# Patient Record
Sex: Female | Born: 1995 | Race: Black or African American | Hispanic: No | Smoking: Never smoker
Health system: Southern US, Community
[De-identification: ages and names within clinical notes are randomized; demographics above are authoritative.]

## PROBLEM LIST (undated history)

## (undated) ENCOUNTER — Inpatient Hospital Stay (HOSPITAL_COMMUNITY): Payer: Self-pay

## (undated) DIAGNOSIS — R011 Cardiac murmur, unspecified: Secondary | ICD-10-CM

## (undated) HISTORY — PX: ESOPHAGOGASTRODUODENOSCOPY: SHX1529

## (undated) HISTORY — PX: WISDOM TOOTH EXTRACTION: SHX21

---

## 2015-08-24 ENCOUNTER — Encounter (HOSPITAL_COMMUNITY): Payer: Self-pay

## 2015-08-24 ENCOUNTER — Emergency Department (HOSPITAL_COMMUNITY)
Admission: EM | Admit: 2015-08-24 | Discharge: 2015-08-24 | Disposition: A | Discharge status: Home / Self Care | Attending: Emergency Medicine | Admitting: Emergency Medicine

## 2015-08-24 DIAGNOSIS — R1030 Lower abdominal pain, unspecified: Secondary | ICD-10-CM | POA: Diagnosis present

## 2015-08-24 DIAGNOSIS — B9689 Other specified bacterial agents as the cause of diseases classified elsewhere: Secondary | ICD-10-CM

## 2015-08-24 DIAGNOSIS — N760 Acute vaginitis: Secondary | ICD-10-CM | POA: Diagnosis not present

## 2015-08-24 DIAGNOSIS — N72 Inflammatory disease of cervix uteri: Secondary | ICD-10-CM

## 2015-08-24 LAB — URINE MICROSCOPIC-ADD ON

## 2015-08-24 LAB — URINALYSIS, ROUTINE W REFLEX MICROSCOPIC
BILIRUBIN URINE: NEGATIVE
Glucose, UA: NEGATIVE mg/dL
Hgb urine dipstick: NEGATIVE
KETONES UR: NEGATIVE mg/dL
NITRITE: NEGATIVE
PROTEIN: NEGATIVE mg/dL
SPECIFIC GRAVITY, URINE: 1.019 (ref 1.005–1.030)
pH: 8 (ref 5.0–8.0)

## 2015-08-24 LAB — WET PREP, GENITAL
SPERM: NONE SEEN
Trich, Wet Prep: NONE SEEN
YEAST WET PREP: NONE SEEN

## 2015-08-24 LAB — RAPID HIV SCREEN (HIV 1/2 AB+AG)
HIV 1/2 Antibodies: NONREACTIVE
HIV-1 P24 ANTIGEN - HIV24: NONREACTIVE

## 2015-08-24 LAB — POC URINE PREG, ED: PREG TEST UR: NEGATIVE

## 2015-08-24 MED ORDER — METRONIDAZOLE 500 MG PO TABS: 500.0000 mg | ORAL_TABLET | Freq: Two times a day (BID) | ORAL | 0 refills | Status: DC

## 2015-08-24 MED ORDER — DOXYCYCLINE HYCLATE 100 MG PO CAPS: 100.0000 mg | ORAL_CAPSULE | Freq: Two times a day (BID) | ORAL | 0 refills | Status: DC

## 2015-08-24 NOTE — Discharge Instructions (Signed)
You have been evaluated for your lower abdominal pain.  There's evidence of infection in your urine.  We have obtain labs to check for potential STD.  You will be notified if you are tested positive for any specific infection that we can treat.  Take antibiotics as prescribed for the full duration. A void drinking alcohol while on antibiotic.  Avoid sexual activities until your symptoms resolved.

## 2015-08-24 NOTE — ED Triage Notes (Signed)
Pt c/o intermittent lower abdominal pain x 1 week.  Pain score 5/10.  Sts pain is worse with sitting.  Sts LMP x 2 weeks ago and was "lighter than normal."  Denies n/v/d.  Denies GU complaints.

## 2015-08-24 NOTE — ED Provider Notes (Signed)
[facility]-EMERGENCY DEPT Provider Note   CSN: [id number] Arrival date & time: 08/24/15  0626  First Provider Contact:  None       History   Chief Complaint Chief Complaint  Patient presents with  . Abdominal Pain    HPI Annette Steele is a 20 y.o. female.  HPI   20 year old female presents for evaluation of abdominal pain. Patient reports gradual onset of persistent low abdominal pain ongoing for the past week. She described pain as sharp achy throbbing sensation, worsening when she sits stills and improves when she moves around. She rates her pain as 7 out of 10. She denies any specific treatment tried. She denies any associated fever, headache, lightheadedness, dizziness, chest pain, shortness of breath, back pain, dysuria, hematuria, vaginal bleeding, vaginal discharge, or rash. She is sexually active and does not use protection every single time. Remote history of STD. Last menstrual period was 2 weeks ago. She denies any recent strenuous activities. Denies any postprandial pain, nausea vomiting diarrhea or change in bowel or bladder habit.    History reviewed. No pertinent past medical history.  There are no active problems to display for this patient.   Past Surgical History:  Procedure Laterality Date  . ESOPHAGOGASTRODUODENOSCOPY      OB History    No data available       Home Medications    Prior to Admission medications   Not on File    Family History History reviewed. No pertinent family history.  Social History Social History  Substance Use Topics  . Smoking status: Never Smoker  . Smokeless tobacco: Never Used  . Alcohol use No     Allergies   Review of patient's allergies indicates not on file.   Review of Systems Review of Systems  All other systems reviewed and are negative.    Physical Exam Updated Vital Signs BP 141/74 (BP Location: Left Arm)   Pulse 84   Temp 97.9 F (36.6 C) (Oral)   Resp 16   LMP 08/08/2015   SpO2 100%    Physical Exam  Constitutional: She appears well-developed and well-nourished. No distress.  African-American female, well-appearing, in no acute discomfort.  HENT:  Head: Atraumatic.  Eyes: Conjunctivae are normal.  Neck: Neck supple.  Cardiovascular: Normal rate and regular rhythm.   Pulmonary/Chest: Effort normal and breath sounds normal.  Abdominal: Soft. There is tenderness (Suprapubic tenderness on palpation without guarding or rebound tenderness. Negative Murphy sign, no pain at McBurney's point, no peritoneal sign.).  Genitourinary:  Genitourinary Comments: Chaperone present during exam. 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 These lesions are nontender.  is no pain with speculum insertion. Closed cervical os with mild functional vaginal discharge and no bleeding. On bimanual examination, no adnexal tenderness or cervical motion tenderness.   Neurological: She is alert.  Skin: No rash noted.  Psychiatric: She has a normal mood and affect.  Nursing note and vitals reviewed.    ED Treatments / Results  Labs (all labs ordered are listed, but only abnormal results are displayed) Labs Reviewed  WET PREP, GENITAL - Abnormal; Notable for the following:       Result Value   Clue Cells Wet Prep HPF POC PRESENT (*)    WBC, Wet Prep HPF POC MANY (*)    All other components within normal limits  URINALYSIS, ROUTINE W REFLEX MICROSCOPIC (NOT AT [facility]) - Abnormal; Notable for the following:    APPearance CLOUDY (*)  Leukocytes, UA MODERATE (*)    All other components within normal limits  URINE MICROSCOPIC-ADD ON - Abnormal; Notable for the following:    Squamous Epithelial / LPF 6-30 (*)    Bacteria, UA MANY (*)    All other components within normal limits  RAPID HIV SCREEN (HIV 1/2 AB+AG)  RPR  POC URINE PREG, ED  GC/CHLAMYDIA PROBE AMP ([place]) NOT AT [facility]    EKG  EKG Interpretation None       Radiology No results  found.  Procedures Procedures (including critical care time)  Medications Ordered in ED Medications - No data to display   Initial Impression / Assessment and Plan / ED Course  I have reviewed the triage vital signs and the nursing notes.  Pertinent labs & imaging results that were available during my care of the patient were reviewed by me and considered in my medical decision making (see chart for details).  Clinical Course    BP 121/81 (BP Location: Left Arm)   Pulse 72   Temp 97.9 F (36.6 C) (Oral)   Resp 15   LMP 08/08/2015   SpO2 100%    Final Clinical Impressions(s) / ED Diagnoses   Final diagnoses:  Cervicitis  BV (bacterial vaginosis)    New Prescriptions New Prescriptions   DOXYCYCLINE (VIBRAMYCIN) 100 MG CAPSULE    Take 1 capsule (100 mg total) by mouth 2 (two) times daily.   METRONIDAZOLE (FLAGYL) 500 MG TABLET    Take 1 tablet (500 mg total) by mouth 2 (two) times daily.   9:56 AM Patient here with low abdominal pain. Pain is been ongoing for 1 week. Pain does not suggest appendicitis based on presentation. Pelvic exam be performed. She is afebrile with stable normal vital sign and resting comfortably.  12:37 PM Patient does not have any significant discomfort on pelvic examination. However, her UA shows many bacteria and 6-30 WBC, and a wet prep shows presence of clue cells and many WBC. I'm concerned of potential STD. I plan on treating patient with doxycycline and Flagyl. Her pregnancy test is negative. STD culture sent. Return precaution discussed. She understands to discuss with her sexual partner if she is tested positive for STD.   Fayrene Helper, PA-C 08/24/15 1241    Pricilla Loveless, MD 08/24/15 [phone]

## 2015-08-25 LAB — RPR: RPR Ser Ql: NONREACTIVE

## 2015-08-27 LAB — GC/CHLAMYDIA PROBE AMP (~~LOC~~) NOT AT ARMC
CHLAMYDIA, DNA PROBE: NEGATIVE
Neisseria Gonorrhea: NEGATIVE

## 2015-09-17 ENCOUNTER — Telehealth (HOSPITAL_BASED_OUTPATIENT_CLINIC_OR_DEPARTMENT_OTHER): Payer: Self-pay | Admitting: Emergency Medicine

## 2015-12-23 ENCOUNTER — Encounter (HOSPITAL_COMMUNITY): Payer: Self-pay

## 2015-12-23 ENCOUNTER — Emergency Department (HOSPITAL_COMMUNITY)
Admission: EM | Admit: 2015-12-23 | Discharge: 2015-12-23 | Disposition: A | Discharge status: Home / Self Care | Attending: Emergency Medicine | Admitting: Emergency Medicine

## 2015-12-23 ENCOUNTER — Emergency Department (HOSPITAL_COMMUNITY)

## 2015-12-23 DIAGNOSIS — O23591 Infection of other part of genital tract in pregnancy, first trimester: Secondary | ICD-10-CM | POA: Insufficient documentation

## 2015-12-23 DIAGNOSIS — N760 Acute vaginitis: Secondary | ICD-10-CM

## 2015-12-23 DIAGNOSIS — R102 Pelvic and perineal pain: Secondary | ICD-10-CM

## 2015-12-23 DIAGNOSIS — B9689 Other specified bacterial agents as the cause of diseases classified elsewhere: Secondary | ICD-10-CM | POA: Insufficient documentation

## 2015-12-23 DIAGNOSIS — O0281 Inappropriate change in quantitative human chorionic gonadotropin (hCG) in early pregnancy: Secondary | ICD-10-CM | POA: Diagnosis not present

## 2015-12-23 DIAGNOSIS — O26891 Other specified pregnancy related conditions, first trimester: Secondary | ICD-10-CM | POA: Diagnosis present

## 2015-12-23 DIAGNOSIS — Z3A01 Less than 8 weeks gestation of pregnancy: Secondary | ICD-10-CM | POA: Insufficient documentation

## 2015-12-23 LAB — COMPREHENSIVE METABOLIC PANEL
ALK PHOS: 49 U/L (ref 38–126)
ALT: 12 U/L — AB (ref 14–54)
AST: 15 U/L (ref 15–41)
Albumin: 3.9 g/dL (ref 3.5–5.0)
Anion gap: 6 (ref 5–15)
BUN: 11 mg/dL (ref 6–20)
CALCIUM: 9 mg/dL (ref 8.9–10.3)
CHLORIDE: 105 mmol/L (ref 101–111)
CO2: 25 mmol/L (ref 22–32)
CREATININE: 0.77 mg/dL (ref 0.44–1.00)
Glucose, Bld: 92 mg/dL (ref 65–99)
Potassium: 3.9 mmol/L (ref 3.5–5.1)
SODIUM: 136 mmol/L (ref 135–145)
Total Bilirubin: 0.7 mg/dL (ref 0.3–1.2)
Total Protein: 7 g/dL (ref 6.5–8.1)

## 2015-12-23 LAB — CBC
HCT: 36.7 % (ref 36.0–46.0)
Hemoglobin: 13 g/dL (ref 12.0–15.0)
MCH: 31.2 pg (ref 26.0–34.0)
MCHC: 35.4 g/dL (ref 30.0–36.0)
MCV: 88 fL (ref 78.0–100.0)
PLATELETS: 215 10*3/uL (ref 150–400)
RBC: 4.17 MIL/uL (ref 3.87–5.11)
RDW: 11.8 % (ref 11.5–15.5)
WBC: 8.9 10*3/uL (ref 4.0–10.5)

## 2015-12-23 LAB — DIFFERENTIAL
BASOS PCT: 1 %
Basophils Absolute: 0 10*3/uL (ref 0.0–0.1)
EOS ABS: 0.2 10*3/uL (ref 0.0–0.7)
EOS PCT: 3 %
Lymphocytes Relative: 35 %
Lymphs Abs: 3.1 10*3/uL (ref 0.7–4.0)
MONO ABS: 0.5 10*3/uL (ref 0.1–1.0)
MONOS PCT: 6 %
Neutro Abs: 5 10*3/uL (ref 1.7–7.7)
Neutrophils Relative %: 55 %

## 2015-12-23 LAB — URINALYSIS, ROUTINE W REFLEX MICROSCOPIC
BILIRUBIN URINE: NEGATIVE
GLUCOSE, UA: NEGATIVE mg/dL
HGB URINE DIPSTICK: NEGATIVE
KETONES UR: NEGATIVE mg/dL
Leukocytes, UA: NEGATIVE
Nitrite: NEGATIVE
PROTEIN: NEGATIVE mg/dL
Specific Gravity, Urine: 1.024 (ref 1.005–1.030)
pH: 7 (ref 5.0–8.0)

## 2015-12-23 LAB — WET PREP, GENITAL
Sperm: NONE SEEN
Trich, Wet Prep: NONE SEEN
YEAST WET PREP: NONE SEEN

## 2015-12-23 LAB — HCG, QUANTITATIVE, PREGNANCY: HCG, BETA CHAIN, QUANT, S: 4968 m[IU]/mL — AB (ref <5)

## 2015-12-23 LAB — LIPASE, BLOOD: LIPASE: 21 U/L (ref 11–51)

## 2015-12-23 MED ORDER — ONDANSETRON HCL 4 MG/2ML IJ SOLN
4.0000 mg | Freq: Once | INTRAMUSCULAR | Status: AC
  Administered 2015-12-23: 4 mg via INTRAVENOUS
  Filled 2015-12-23: qty 2

## 2015-12-23 MED ORDER — MORPHINE SULFATE (PF) 4 MG/ML IV SOLN
4.0000 mg | Freq: Once | INTRAVENOUS | Status: AC
  Administered 2015-12-23: 4 mg via INTRAVENOUS
  Filled 2015-12-23: qty 1

## 2015-12-23 MED ORDER — METRONIDAZOLE 500 MG PO TABS: 500.0000 mg | ORAL_TABLET | Freq: Two times a day (BID) | ORAL | 0 refills | Status: DC

## 2015-12-23 NOTE — ED Provider Notes (Signed)
[facility]PT Provider Note   CSN: [id number] Arrival date & time: 12/23/15  1420     History   Chief Complaint Chief Complaint  Patient presents with  . Abdominal Pain    HPI Annette Steele is a 20 y.o. female.  Patient presents to the emergency department with chief complaint of lower abdominal pain. She states that she has been having intermittent pelvic/lower abdominal pain for the past 4-5 days. She states that it comes in waves, and feels sharp and is rated a 8 out of 10 at its worst. Currently she is experiencing 4 out of 10 pain. She has not tried taking anything for her symptoms. She denies any fevers, chills, vomiting, diarrhea, dysuria, or vaginal discharge. She states that she has felt nauseated from time to time. There are no modifying factors. She denies any prior abdominal surgeries.   The history is provided by the patient. No language interpreter was used.    History reviewed. No pertinent past medical history.  There are no active problems to display for this patient.   Past Surgical History:  Procedure Laterality Date  . ESOPHAGOGASTRODUODENOSCOPY      OB History    No data available       Home Medications    Prior to Admission medications   Medication Sig Start Date End Date Taking? Authorizing Provider  cetirizine (ZYRTEC) 10 MG tablet Take 10 mg by mouth daily as needed for allergies.    Historical Provider, MD  doxycycline (VIBRAMYCIN) 100 MG capsule Take 1 capsule (100 mg total) by mouth 2 (two) times daily. 08/24/15   Fayrene HelperBowie Tran, PA-C  metroNIDAZOLE (FLAGYL) 500 MG tablet Take 1 tablet (500 mg total) by mouth 2 (two) times daily. 08/24/15   Fayrene HelperBowie Tran, PA-C  omeprazole (PRILOSEC) 20 MG capsule Take 20 mg by mouth daily.    Historical Provider, MD    Family History History reviewed. No pertinent family history.  Social History Social History  Substance Use Topics  . Smoking status: Never Smoker  . Smokeless tobacco: Never Used  .  Alcohol use No     Allergies   Patient has no known allergies.   Review of Systems Review of Systems  Gastrointestinal: Positive for abdominal pain and nausea.  All other systems reviewed and are negative.    Physical Exam Updated Vital Signs BP 132/67 (BP Location: Left Arm)   Pulse 81   Temp 98 F (36.7 C) (Oral)   Resp 16   Ht 5\' 3"  (1.6 m)   Wt 73.4 kg   LMP 11/22/2015   SpO2 100%   BMI 28.66 kg/m   Physical Exam  Constitutional: She is oriented to person, place, and time. She appears well-developed and well-nourished.  HENT:  Head: Normocephalic and atraumatic.  Eyes: Conjunctivae and EOM are normal. Pupils are equal, round, and reactive to light.  Neck: Normal range of motion. Neck supple.  Cardiovascular: Normal rate and regular rhythm.  Exam reveals no gallop and no friction rub.   No murmur heard. Pulmonary/Chest: Effort normal and breath sounds normal. No respiratory distress. She has no wheezes. She has no rales. She exhibits no tenderness.  Abdominal: Soft. Bowel sounds are normal. She exhibits no distension and no mass. There is no tenderness. There is no rebound and no guarding.  No focal abdominal tenderness, no RLQ tenderness or pain at McBurney's point, no RUQ tenderness or Murphy's sign, no left-sided abdominal tenderness, no fluid wave, or signs of peritonitis   Genitourinary:  Genitourinary Comments: Left adnexal tenderness, mild vaginal discharge, no other abnormality noted on pelvic exam.  Chaperone present during entire exam.  Musculoskeletal: Normal range of motion. She exhibits no edema or tenderness.  Neurological: She is alert and oriented to person, place, and time.  Skin: Skin is warm and dry.  Psychiatric: She has a normal mood and affect. Her behavior is normal. Judgment and thought content normal.  Nursing note and vitals reviewed.    ED Treatments / Results  Labs (all labs ordered are listed, but only abnormal results are  displayed) Labs Reviewed  CBC  DIFFERENTIAL  LIPASE, BLOOD  COMPREHENSIVE METABOLIC PANEL  URINALYSIS, ROUTINE W REFLEX MICROSCOPIC (NOT [facility])  HCG, QUANTITATIVE, PREGNANCY  CBC WITH DIFFERENTIAL/PLATELET    EKG  EKG Interpretation None       Radiology No results found.  Procedures Procedures (including critical care time)  Medications Ordered in ED Medications  morphine 4 MG/ML injection 4 mg (not administered)  ondansetron (ZOFRAN) injection 4 mg (not administered)     Initial Impression / Assessment and Plan / ED Course  I have reviewed the triage vital signs and the nursing notes.  Pertinent labs & imaging results that were available during my care of the patient were reviewed by me and considered in my medical decision making (see chart for details).  Clinical Course     Patient with lower abdominal pain 4-5 days. Will check labs, treat pain, and reassess. Denies dysuria or hematuria. Denies any vaginal discharge.   HCG is elevated. Patient does have some left adnexal tenderness. Will check ultrasound.  Clue cells seen on wet prep. Will treat for bacterial vaginosis.  No vaginal bleeding.  Ultrasound is remarkable for suspected intrauterine pregnancy, however follow-up hCG is recommended. I discussed this plan with patient. She'll follow-[facility] with her OB[facility]GYN [facility] for repeat hCG.  Final Clinical Impressions(s) / ED Diagnoses   Final diagnoses:  BV (bacterial vaginosis)  Less than [redacted] weeks gestation of pregnancy    New Prescriptions Discharge Medication List as of 12/23/2015  5:58 PM       Roxy Horsemanobert Kuron Docken, PA-C 12/23/15 2204    Linwood DibblesJon Knapp, MD 12/24/15 1154

## 2015-12-23 NOTE — ED Triage Notes (Signed)
PT C/O GENERALIZED ABDOMINAL PAIN X3 DAYS. PT STS THE PAIN COMES IN DIFFERENT AREAS OF HER STOMACH AND SOMETIMES AROUND THE HIP AREAS. PT ALSO STS URINARY FREQUENCY. DENIES FEVER, VOMITING,OR DIARRHEA, BUT HAD 1 EPISODE OF NAUSEA.

## 2015-12-25 ENCOUNTER — Other Ambulatory Visit

## 2015-12-25 DIAGNOSIS — Z3A08 8 weeks gestation of pregnancy: Secondary | ICD-10-CM

## 2015-12-26 LAB — HCG, QUANTITATIVE, PREGNANCY: hCG, Beta Chain, Quant, S: 7739.7 m[IU]/mL — ABNORMAL HIGH

## 2015-12-26 LAB — GC/CHLAMYDIA PROBE AMP (~~LOC~~) NOT AT ARMC
Chlamydia: NEGATIVE
Neisseria Gonorrhea: NEGATIVE

## 2015-12-27 ENCOUNTER — Telehealth: Payer: Self-pay | Admitting: *Deleted

## 2015-12-27 NOTE — Telephone Encounter (Signed)
Pt left message yesterday requesting test results.  

## 2016-01-08 NOTE — Telephone Encounter (Signed)
Called patient back and informed her of results and need for follow up ultrasound. Patient states she just had an ultrasound last Friday in [place]. Patient had no questions or concerns

## 2016-01-28 NOTE — L&D Delivery Note (Signed)
Patient is a 21 y.o. now G2P2002 s/p NSVD at 758w0d, who was admitted for SOL.  Delivery Note At 4:15 PM a viable female was delivered via Vaginal, Spontaneous (Presentation: direct OA).  APGAR:\ 9, 9; weight pending .   Placenta status: intact 3VC:  with the following complications: nuchal/body cord .  Cord pH: n/a  Anesthesia:  epidural Episiotomy: None Lacerations: None Suture Repair: n/a Est. Blood Loss (mL): 110  Head delivered direct OA. Body and nuchal cord present. Shoulder and body delivered with corkscew technique through cord which was then reduced. Infant with spontaneous cry, placed on mother's abdomen, dried and bulb suctioned. Cord clamped x 2 after 1-minute delay, and cut by family member. Cord blood drawn. Placenta delivered spontaneously with gentle cord traction. Fundus firm with massage and Pitocin. Perineum inspected and found to have no  Lacerations requiring repair  Mom to postpartum.  Baby to Couplet care / Skin to Skin.  Marthenia RollingScott Bland 12/23/2016, 4:36 PM  Please schedule this patient for PP visit in: 4 weeks Low risk pregnancy complicated by: none Delivery mode:  SVD Anticipated Birth Control:  other/unsure PP Procedures needed: none  Schedule Integrated BH visit: no Provider: Any provider

## 2016-04-09 ENCOUNTER — Emergency Department (HOSPITAL_COMMUNITY)
Admission: EM | Admit: 2016-04-09 | Discharge: 2016-04-09 | Disposition: A | Discharge status: Home / Self Care | Attending: Emergency Medicine | Admitting: Emergency Medicine

## 2016-04-09 ENCOUNTER — Encounter (HOSPITAL_COMMUNITY): Payer: Self-pay | Admitting: Family Medicine

## 2016-04-09 DIAGNOSIS — K2900 Acute gastritis without bleeding: Secondary | ICD-10-CM | POA: Insufficient documentation

## 2016-04-09 DIAGNOSIS — Z79899 Other long term (current) drug therapy: Secondary | ICD-10-CM | POA: Insufficient documentation

## 2016-04-09 DIAGNOSIS — R109 Unspecified abdominal pain: Secondary | ICD-10-CM | POA: Diagnosis present

## 2016-04-09 LAB — COMPREHENSIVE METABOLIC PANEL
ALBUMIN: 4.1 g/dL (ref 3.5–5.0)
ALK PHOS: 54 U/L (ref 38–126)
ALT: 11 U/L — AB (ref 14–54)
ANION GAP: 6 (ref 5–15)
AST: 16 U/L (ref 15–41)
BILIRUBIN TOTAL: 0.6 mg/dL (ref 0.3–1.2)
BUN: 9 mg/dL (ref 6–20)
CALCIUM: 9.2 mg/dL (ref 8.9–10.3)
CO2: 23 mmol/L (ref 22–32)
Chloride: 109 mmol/L (ref 101–111)
Creatinine, Ser: 0.76 mg/dL (ref 0.44–1.00)
GFR calc Af Amer: >60 mL/min (ref >60)
GFR calc non Af Amer: >60 mL/min (ref >60)
GLUCOSE: 90 mg/dL (ref 65–99)
Potassium: 3.7 mmol/L (ref 3.5–5.1)
SODIUM: 138 mmol/L (ref 135–145)
TOTAL PROTEIN: 7.6 g/dL (ref 6.5–8.1)

## 2016-04-09 LAB — POC URINE PREG, ED: Preg Test, Ur: NEGATIVE

## 2016-04-09 LAB — CBC
HCT: 37.3 % (ref 36.0–46.0)
HEMOGLOBIN: 13.1 g/dL (ref 12.0–15.0)
MCH: 30.5 pg (ref 26.0–34.0)
MCHC: 35.1 g/dL (ref 30.0–36.0)
MCV: 86.9 fL (ref 78.0–100.0)
Platelets: 239 10*3/uL (ref 150–400)
RBC: 4.29 MIL/uL (ref 3.87–5.11)
RDW: 11.9 % (ref 11.5–15.5)
WBC: 9.2 10*3/uL (ref 4.0–10.5)

## 2016-04-09 LAB — URINALYSIS, ROUTINE W REFLEX MICROSCOPIC
BILIRUBIN URINE: NEGATIVE
Glucose, UA: NEGATIVE mg/dL
HGB URINE DIPSTICK: NEGATIVE
Ketones, ur: NEGATIVE mg/dL
NITRITE: NEGATIVE
PH: 7 (ref 5.0–8.0)
Protein, ur: NEGATIVE mg/dL
SPECIFIC GRAVITY, URINE: 1.006 (ref 1.005–1.030)

## 2016-04-09 LAB — LIPASE, BLOOD: Lipase: 19 U/L (ref 11–51)

## 2016-04-09 MED ORDER — OMEPRAZOLE 20 MG PO CPDR: 20.0000 mg | DELAYED_RELEASE_CAPSULE | Freq: Every day | ORAL | 0 refills | Status: DC

## 2016-04-09 MED ORDER — GI COCKTAIL ~~LOC~~
30.0000 mL | Freq: Once | ORAL | Status: AC
  Administered 2016-04-09: 30 mL via ORAL
  Filled 2016-04-09: qty 30

## 2016-04-09 NOTE — ED Provider Notes (Signed)
[facility] DEPT Provider Note   CSN: [id number] Arrival date & time: 04/09/16  1707   History   Chief Complaint Chief Complaint  Patient presents with  . Abdominal Pain  . Vaginal Discharge    HPI Annette Kocherlbony Guardiola is a 21 y.o. female.  HPI   21 year old female presents today with complaints of abdominal pain.  Patient notes symptoms started at the beginning of the month.  She notes a vague discomfort, but is having difficulty describing it.  She notes this is coming and going.  Not worsened with eating or drinking, no other exacerbating factors.  She denies any nausea or vomiting, diarrhea or changes in bowel habits.  She denies any urinary changes.  Patient notes she was seen at urgent care last week with analysis there showing no significant findings.  Patient does note a previous history of H. pylori infection.  Patient notes that she has not been taking omeprazole.  No recent significant alcohol or NSAID use.  Patient denies any concerning vaginal discharge.   History reviewed. No pertinent past medical history.  There are no active problems to display for this patient.   Past Surgical History:  Procedure Laterality Date  . ESOPHAGOGASTRODUODENOSCOPY      OB History    No data available       Home Medications    Prior to Admission medications   Medication Sig Start Date End Date Taking? Authorizing Provider  cetirizine (ZYRTEC) 10 MG tablet Take 10 mg by mouth daily as needed for allergies.   Yes Historical Provider, MD  metroNIDAZOLE (FLAGYL) 500 MG tablet Take 1 tablet (500 mg total) by mouth 2 (two) times daily. Patient not taking: Reported on 04/09/2016 12/23/15   Roxy Horsemanobert Browning, PA-C  omeprazole (PRILOSEC) 20 MG capsule Take 1 capsule (20 mg total) by mouth daily. 04/09/16   Eyvonne MechanicJeffrey Traylen Eckels, PA-C    Family History History reviewed. No pertinent family history.  Social History Social History  Substance Use Topics  . Smoking status: Never Smoker  .  Smokeless tobacco: Never Used  . Alcohol use No     Allergies   Patient has no known allergies.   Review of Systems Review of Systems  All other systems reviewed and are negative.    Physical Exam Updated Vital Signs BP 124/76 (BP Location: Left Arm)   Pulse 83   Temp 98 F (36.7 C) (Oral)   Resp 18   Ht 5\' 3"  (1.6 m)   Wt 68 kg   LMP 03/31/2016   SpO2 99%   BMI 26.57 kg/m   Physical Exam  Constitutional: She is oriented to person, place, and time. She appears well-developed and well-nourished.  HENT:  Head: Normocephalic and atraumatic.  Eyes: Conjunctivae are normal. Pupils are equal, round, and reactive to light. Right eye exhibits no discharge. Left eye exhibits no discharge. No scleral icterus.  Neck: Normal range of motion. No JVD present. No tracheal deviation present.  Pulmonary/Chest: Effort normal. No stridor.  Abdominal: Soft. She exhibits no distension and no mass. There is no tenderness. There is no rebound and no guarding. No hernia.  Neurological: She is alert and oriented to person, place, and time. Coordination normal.  Psychiatric: She has a normal mood and affect. Her behavior is normal. Judgment and thought content normal.  Nursing note and vitals reviewed.    ED Treatments / Results  Labs (all labs ordered are listed, but only abnormal results are displayed) Labs Reviewed  COMPREHENSIVE METABOLIC PANEL - Abnormal;  Notable for the following:       Result Value   ALT 11 (*)    All other components within normal limits  URINALYSIS, ROUTINE W REFLEX MICROSCOPIC - Abnormal; Notable for the following:    Color, Urine STRAW (*)    Leukocytes, UA MODERATE (*)    Bacteria, UA RARE (*)    Squamous Epithelial / LPF 0-5 (*)    All other components within normal limits  LIPASE, BLOOD  CBC  POC URINE PREG, ED    EKG  EKG Interpretation None       Radiology No results found.  Procedures Procedures (including critical care  time)  Medications Ordered in ED Medications  gi cocktail (Maalox,Lidocaine,Donnatal) (30 mLs Oral Given 04/09/16 1945)     Initial Impression / Assessment and Plan / ED Course  I have reviewed the triage vital signs and the nursing notes.  Pertinent labs & imaging results that were available during my care of the patient were reviewed by me and considered in my medical decision making (see chart for details).     Final Clinical Impressions(s) / ED Diagnoses   Final diagnoses:  Other acute gastritis without hemorrhage   21 year old female presents today with likely gastritis.  She is very well-appearing in no acute distress.  She has a history of the same in the past.  She was given a GI cocktail here which improved her symptoms.  She has no infectious etiology, reassuring laboratory analysis.  She will be treated with omeprazole, primary care follow-up, strict return precautions.  She verbalized understanding and agreement to today's plan had no further questions or concerns at time of discharge.    New Prescriptions Discharge Medication List as of 04/09/2016  8:23 PM       Eyvonne Mechanic, PA-C 04/09/16 2121    Melene Plan, DO 04/09/16 2320

## 2016-04-09 NOTE — Discharge Instructions (Signed)
Please read attached information. If you experience any new or worsening signs or symptoms please return to the emergency room for evaluation. Please follow-up with your primary care provider or specialist as discussed. Please use medication prescribed only as directed and discontinue taking if you have any concerning signs or symptoms.   °

## 2016-04-09 NOTE — ED Triage Notes (Signed)
Patient reports she is experiencing lower abd pain with clear vaginal discharge since April 01, 2016. Pt went to a [facility] for treatment. Pt reports all of her test normal.

## 2016-04-23 ENCOUNTER — Emergency Department (HOSPITAL_COMMUNITY)
Admission: EM | Admit: 2016-04-23 | Discharge: 2016-04-23 | Disposition: A | Discharge status: Home / Self Care | Attending: Emergency Medicine | Admitting: Emergency Medicine

## 2016-04-23 ENCOUNTER — Encounter (HOSPITAL_COMMUNITY): Payer: Self-pay | Admitting: *Deleted

## 2016-04-23 DIAGNOSIS — R1030 Lower abdominal pain, unspecified: Secondary | ICD-10-CM | POA: Diagnosis not present

## 2016-04-23 DIAGNOSIS — Z5321 Procedure and treatment not carried out due to patient leaving prior to being seen by health care provider: Secondary | ICD-10-CM | POA: Diagnosis not present

## 2016-04-23 LAB — COMPREHENSIVE METABOLIC PANEL
ALK PHOS: 57 U/L (ref 38–126)
ALT: 14 U/L (ref 14–54)
ANION GAP: 6 (ref 5–15)
AST: 16 U/L (ref 15–41)
Albumin: 3.9 g/dL (ref 3.5–5.0)
BILIRUBIN TOTAL: 0.7 mg/dL (ref 0.3–1.2)
BUN: 10 mg/dL (ref 6–20)
CALCIUM: 9 mg/dL (ref 8.9–10.3)
CO2: 25 mmol/L (ref 22–32)
CREATININE: 0.75 mg/dL (ref 0.44–1.00)
Chloride: 104 mmol/L (ref 101–111)
GFR calc non Af Amer: >60 mL/min (ref >60)
GLUCOSE: 91 mg/dL (ref 65–99)
Potassium: 3.8 mmol/L (ref 3.5–5.1)
Sodium: 135 mmol/L (ref 135–145)
TOTAL PROTEIN: 7.6 g/dL (ref 6.5–8.1)

## 2016-04-23 LAB — CBC
HCT: 37 % (ref 36.0–46.0)
HEMOGLOBIN: 13.1 g/dL (ref 12.0–15.0)
MCH: 30 pg (ref 26.0–34.0)
MCHC: 35.4 g/dL (ref 30.0–36.0)
MCV: 84.9 fL (ref 78.0–100.0)
PLATELETS: 224 10*3/uL (ref 150–400)
RBC: 4.36 MIL/uL (ref 3.87–5.11)
RDW: 11.7 % (ref 11.5–15.5)
WBC: 9.2 10*3/uL (ref 4.0–10.5)

## 2016-04-23 LAB — URINALYSIS, ROUTINE W REFLEX MICROSCOPIC
BILIRUBIN URINE: NEGATIVE
Glucose, UA: NEGATIVE mg/dL
HGB URINE DIPSTICK: NEGATIVE
Ketones, ur: NEGATIVE mg/dL
Leukocytes, UA: NEGATIVE
NITRITE: NEGATIVE
PROTEIN: NEGATIVE mg/dL
SPECIFIC GRAVITY, URINE: 1.018 (ref 1.005–1.030)
pH: 7 (ref 5.0–8.0)

## 2016-04-23 LAB — POC URINE PREG, ED: PREG TEST UR: NEGATIVE

## 2016-04-23 LAB — LIPASE, BLOOD: Lipase: 21 U/L (ref 11–51)

## 2016-04-23 NOTE — ED Notes (Signed)
Patient stated that she could not wait any longer due to her having to pick up her child.

## 2016-04-23 NOTE — ED Triage Notes (Signed)
Pt reports lower abdominal pain since beginning of the month. Pt was seen 2 weeks ago in the ED and prescribed omeprazole. Pt states she has not had relief with omeprazole. Pt has hx of h-pylori in 2015. Pt denies vaginal discharge or n/v/d.

## 2016-05-01 ENCOUNTER — Encounter (HOSPITAL_COMMUNITY): Payer: Self-pay | Admitting: Emergency Medicine

## 2016-05-01 ENCOUNTER — Ambulatory Visit (HOSPITAL_COMMUNITY)
Admission: EM | Admit: 2016-05-01 | Discharge: 2016-05-01 | Disposition: A | Discharge status: Home / Self Care | Attending: Family Medicine | Admitting: Family Medicine

## 2016-05-01 DIAGNOSIS — Z3201 Encounter for pregnancy test, result positive: Secondary | ICD-10-CM

## 2016-05-01 DIAGNOSIS — R030 Elevated blood-pressure reading, without diagnosis of hypertension: Secondary | ICD-10-CM

## 2016-05-01 LAB — POCT PREGNANCY, URINE: Preg Test, Ur: POSITIVE — AB

## 2016-05-01 NOTE — ED Triage Notes (Signed)
Pt reports she was told she had HBP 2 days ago at work  BP was 140/80 and pulse was 108 x2 days ago... sts it was rechecked w/in minutes again and it was elevated  BP today at work was 142/87 and pulse 121  Pt sts she is pregnant... LMP was 04/01/16  Has had 5 pos preg tests at home.   Sx today include tension HA.   A&O x4... NAD

## 2016-05-01 NOTE — ED Provider Notes (Signed)
CSN: [id number]     Arrival date & time 05/01/16  1734 History   None    Chief Complaint  Patient presents with  . Hypertension   (Consider location/radiation/quality/duration/timing/severity/associated sxs/prior Treatment) 21 year old female states she has been having some morning nausea recently and was suggested that she might be pregnant by coworkers she states that she self tested for pregnancy 5 times and all were positive at home. Her second complaint was that of blood pressure checks at work where she had 140-143  systolic and as high as 87 diastolic. Also noted was increased pulse rate 105 - 116. She states there is a lot of stress and pressure at work. Denies any sort of chest pain, no shortness of breath. She does complain of some pain across the abdomen pointing to the mid abdomen. No pelvic pain or bleeding. She is concerned about the blood pressure thinking that she may need to be medically treated for this.      History reviewed. No pertinent past medical history. Past Surgical History:  Procedure Laterality Date  . ESOPHAGOGASTRODUODENOSCOPY     History reviewed. No pertinent family history. Social History  Substance Use Topics  . Smoking status: Never Smoker  . Smokeless tobacco: Never Used  . Alcohol use No   OB History    Gravida Para Term Preterm AB Living   1             SAB TAB Ectopic Multiple Live Births                 Review of Systems  Constitutional: Negative.   HENT: Negative.   Respiratory: Negative.  Negative for cough, chest tightness and shortness of breath.   Cardiovascular: Negative for chest pain and leg swelling.  Gastrointestinal: Positive for abdominal pain and nausea.  Genitourinary: Negative.   Musculoskeletal: Negative.   Neurological: Positive for dizziness.  All other systems reviewed and are negative.   Allergies  Patient has no known allergies.  Home Medications   Prior to Admission medications   Medication Sig Start  Date End Date Taking? Authorizing Provider  cetirizine (ZYRTEC) 10 MG tablet Take 10 mg by mouth daily as needed for allergies.    Historical Provider, MD  omeprazole (PRILOSEC) 20 MG capsule Take 1 capsule (20 mg total) by mouth daily. 04/09/16   Eyvonne Mechanic, PA-C   Meds Ordered and Administered this Visit  Medications - No data to display  BP 122/67 (BP Location: Left Arm)   Pulse 95   Temp 98.4 F (36.9 C) (Oral)   Resp 14   LMP 04/01/2016 (Approximate)   SpO2 100%  No data found.   Physical Exam  Constitutional: She is oriented to person, place, and time. She appears well-developed and well-nourished. No distress.  HENT:  Head: Normocephalic and atraumatic.  Mouth/Throat: No oropharyngeal exudate.  Eyes: EOM are normal.  Neck: Normal range of motion. Neck supple.  Cardiovascular: Intact distal pulses.   Murmur heard. Mild tachycardia. Grade 2 to 3/6 murmur heard best at the right upper sternal border. At times there appears to be an S4.  Pulmonary/Chest: Effort normal and breath sounds normal. No respiratory distress. She has no wheezes. She has no rales. She exhibits no tenderness.  Abdominal: Soft. Bowel sounds are normal. She exhibits no mass. There is no tenderness. There is no guarding.  Palpation of the abdomen without tenderness, guarding or mass. The area of discomfort is at and below the umbilicus.  Musculoskeletal: Normal range of  motion.  Lymphadenopathy:    She has no cervical adenopathy.  Neurological: She is alert and oriented to person, place, and time. No cranial nerve deficit.  Skin: Skin is warm and dry.  Psychiatric: She has a normal mood and affect. Her behavior is normal.  Nursing note and vitals reviewed.   Urgent Care Course     Procedures (including critical care time)  Labs Review Labs Reviewed  POCT PREGNANCY, URINE - Abnormal; Notable for the following:       Result Value   Preg Test, Ur POSITIVE (*)    All other components within  normal limits    Imaging Review No results found.   Visual Acuity Review  Right Eye Distance:   Left Eye Distance:   Bilateral Distance:    Right Eye Near:   Left Eye Near:    Bilateral Near:         MDM   1. Elevated blood pressure reading without diagnosis of hypertension   2. Positive pregnancy test    Follow-up with primary care for test. The blood pressure is not too  concerning at this time that it is borderline and often normal. AM concerned about the elevation of pulse rate. You will need to have your thyroid checked as well as some other blood tests. If you develop shortness of breath or chest pain you need to be seen immediately. Pregnancy test was positive.     Hayden Rasmussen, NP 05/01/16 [phone]

## 2016-05-01 NOTE — Discharge Instructions (Signed)
Follow-up with primary care for test. The blood pressure is not too discussed concerning at this time that it is borderline and often normal. AM concerned about the elevation of pulse rate. You will need to have your thyroid checked as well as some other blood tests. If you develop shortness of breath or chest pain you need to be seen immediately. Prevacid test was positive.

## 2016-05-15 ENCOUNTER — Encounter (HOSPITAL_COMMUNITY): Payer: Self-pay | Admitting: *Deleted

## 2016-05-15 ENCOUNTER — Emergency Department (HOSPITAL_COMMUNITY)
Admission: EM | Admit: 2016-05-15 | Discharge: 2016-05-15 | Disposition: A | Discharge status: Home / Self Care | Attending: Emergency Medicine | Admitting: Emergency Medicine

## 2016-05-15 DIAGNOSIS — O219 Vomiting of pregnancy, unspecified: Secondary | ICD-10-CM | POA: Diagnosis not present

## 2016-05-15 DIAGNOSIS — Z3A01 Less than 8 weeks gestation of pregnancy: Secondary | ICD-10-CM | POA: Diagnosis not present

## 2016-05-15 DIAGNOSIS — O0281 Inappropriate change in quantitative human chorionic gonadotropin (hCG) in early pregnancy: Secondary | ICD-10-CM | POA: Insufficient documentation

## 2016-05-15 LAB — URINALYSIS, ROUTINE W REFLEX MICROSCOPIC
Bilirubin Urine: NEGATIVE
Glucose, UA: 50 mg/dL — AB
Hgb urine dipstick: NEGATIVE
Ketones, ur: NEGATIVE mg/dL
Nitrite: NEGATIVE
Protein, ur: NEGATIVE mg/dL
Specific Gravity, Urine: 1.024 (ref 1.005–1.030)
pH: 6 (ref 5.0–8.0)

## 2016-05-15 LAB — CBC WITH DIFFERENTIAL/PLATELET
Basophils Absolute: 0 10*3/uL (ref 0.0–0.1)
Basophils Relative: 0 %
Eosinophils Absolute: 0.2 10*3/uL (ref 0.0–0.7)
Eosinophils Relative: 2 %
HCT: 36.1 % (ref 36.0–46.0)
Hemoglobin: 12.6 g/dL (ref 12.0–15.0)
Lymphocytes Relative: 30 %
Lymphs Abs: 3.8 10*3/uL (ref 0.7–4.0)
MCH: 30.2 pg (ref 26.0–34.0)
MCHC: 34.9 g/dL (ref 30.0–36.0)
MCV: 86.6 fL (ref 78.0–100.0)
Monocytes Absolute: 0.6 10*3/uL (ref 0.1–1.0)
Monocytes Relative: 5 %
Neutro Abs: 7.8 10*3/uL — ABNORMAL HIGH (ref 1.7–7.7)
Neutrophils Relative %: 63 %
Platelets: 199 10*3/uL (ref 150–400)
RBC: 4.17 MIL/uL (ref 3.87–5.11)
RDW: 11.7 % (ref 11.5–15.5)
WBC: 12.6 10*3/uL — ABNORMAL HIGH (ref 4.0–10.5)

## 2016-05-15 LAB — BASIC METABOLIC PANEL
Anion gap: 6 (ref 5–15)
BUN: 8 mg/dL (ref 6–20)
CO2: 24 mmol/L (ref 22–32)
Calcium: 8.8 mg/dL — ABNORMAL LOW (ref 8.9–10.3)
Chloride: 105 mmol/L (ref 101–111)
Creatinine, Ser: 0.66 mg/dL (ref 0.44–1.00)
GFR calc Af Amer: >60 mL/min (ref >60)
GFR calc non Af Amer: >60 mL/min (ref >60)
Glucose, Bld: 89 mg/dL (ref 65–99)
Potassium: 4.2 mmol/L (ref 3.5–5.1)
Sodium: 135 mmol/L (ref 135–145)

## 2016-05-15 LAB — HCG, QUANTITATIVE, PREGNANCY: hCG, Beta Chain, Quant, S: 58841 m[IU]/mL — ABNORMAL HIGH (ref <5)

## 2016-05-15 MED ORDER — SODIUM CHLORIDE 0.9 % IV BOLUS (SEPSIS)
1000.0000 mL | Freq: Once | INTRAVENOUS | Status: AC
Start: 2016-05-15 — End: 2016-05-15
  Administered 2016-05-15: 1000 mL via INTRAVENOUS

## 2016-05-15 MED ORDER — ONDANSETRON HCL 4 MG/2ML IJ SOLN
4.0000 mg | Freq: Once | INTRAMUSCULAR | Status: AC
  Administered 2016-05-15: 4 mg via INTRAVENOUS
  Filled 2016-05-15: qty 2

## 2016-05-15 MED ORDER — SODIUM CHLORIDE 0.9 % IV BOLUS (SEPSIS): 1000.0000 mL | Freq: Once | INTRAVENOUS | Status: DC

## 2016-05-15 NOTE — ED Notes (Signed)
IV attempt X1 

## 2016-05-15 NOTE — Discharge Instructions (Signed)
Return here as needed.  Follow-up with the clinic provided.  °

## 2016-05-15 NOTE — ED Triage Notes (Signed)
Pt reports being pregnant, lmp 3/6. Pt having morning sickness, n/v and unable to tolerate drinking water. Pt is fatigued and feeling tired.

## 2016-05-15 NOTE — ED Provider Notes (Signed)
[facility]-EMERGENCY DEPT Provider Note   CSN: [id number] Arrival date & time: 05/15/16  1654     History   Chief Complaint Chief Complaint  Patient presents with  . Emesis    HPI Annette Steele is a 21 y.o. female.  HPI Patient presents to the emergency department withNausea and vomiting in pregnancy.  The patient states that she has had has nausea and vomiting when eating fruit and drinking water.  The patient states that nothing else seems to affect her.  Other than this, the patient states that nothing seems really bother her condition.  Patient states she finishes pregnant several weeks ago. The patient denies chest pain, shortness of breath, headache,blurred vision, neck pain, fever, cough, weakness, numbness, dizziness, anorexia, edema, abdominal pain, nausea, vomiting, diarrhea, rash, back pain, dysuria, hematemesis, bloody stool, near syncope, or syncope. History reviewed. No pertinent past medical history.  There are no active problems to display for this patient.   Past Surgical History:  Procedure Laterality Date  . ESOPHAGOGASTRODUODENOSCOPY      OB History    Gravida Para Term Preterm AB Living   1             SAB TAB Ectopic Multiple Live Births                   Home Medications    Prior to Admission medications   Medication Sig Start Date End Date Taking? Authorizing Provider  cetirizine (ZYRTEC) 10 MG tablet Take 10 mg by mouth daily as needed for allergies.    Historical Provider, MD  omeprazole (PRILOSEC) 20 MG capsule Take 1 capsule (20 mg total) by mouth daily. 04/09/16   Eyvonne Mechanic, PA-C    Family History History reviewed. No pertinent family history.  Social History Social History  Substance Use Topics  . Smoking status: Never Smoker  . Smokeless tobacco: Never Used  . Alcohol use No     Allergies   Patient has no known allergies.   Review of Systems Review of Systems All other systems negative except as documented in the HPI. All  pertinent positives and negatives as reviewed in the HPI.  Physical Exam Updated Vital Signs BP 109/77   Pulse 78   Temp 98.4 F (36.9 C) (Oral)   Resp 17   Ht  (1.6 m)   Wt 75.3 kg   LMP 04/01/2016 (Approximate)   SpO2 100%   BMI 29.41 kg/m   Physical Exam  Constitutional: She is oriented to person, place, and time. She appears well-developed and well-nourished. No distress.  HENT:  Head: Normocephalic and atraumatic.  Mouth/Throat: Oropharynx is clear and moist.  Eyes: Pupils are equal, round, and reactive to light.  Neck: Normal range of motion. Neck supple.  Cardiovascular: Normal rate, regular rhythm and normal heart sounds.  Exam reveals no gallop and no friction rub.   No murmur heard. Pulmonary/Chest: Effort normal and breath sounds normal. No respiratory distress. She has no wheezes.  Abdominal: Soft. Bowel sounds are normal. She exhibits no distension and no mass. There is no tenderness. There is no guarding.  Neurological: She is alert and oriented to person, place, and time. She exhibits normal muscle tone. Coordination normal.  Skin: Skin is warm and dry. Capillary refill takes less than 2 seconds. No rash noted. No erythema.  Psychiatric: She has a normal mood and affect. Her behavior is normal.  Nursing note and vitals reviewed.    ED Treatments / Results  Labs (all  labs ordered are listed, but only abnormal results are displayed) Labs Reviewed  BASIC METABOLIC PANEL - Abnormal; Notable for the following:       Result Value   Calcium 8.8 (*)    All other components within normal limits  CBC WITH DIFFERENTIAL/PLATELET - Abnormal; Notable for the following:    WBC 12.6 (*)    Neutro Abs 7.8 (*)    All other components within normal limits  URINALYSIS, ROUTINE W REFLEX MICROSCOPIC - Abnormal; Notable for the following:    APPearance HAZY (*)    Glucose, UA 50 (*)    Leukocytes, UA SMALL (*)    Bacteria, UA RARE (*)    Squamous Epithelial / LPF 6-30  (*)    All other components within normal limits  HCG, QUANTITATIVE, PREGNANCY - Abnormal; Notable for the following:    hCG, Beta Chain, Quant, S 16,109 (*)    All other components within normal limits    EKG  EKG Interpretation None       Radiology No results found.  Procedures Procedures (including critical care time)  Medications Ordered in ED Medications  sodium chloride 0.9 % bolus 1,000 mL (0 mLs Intravenous Stopped 05/15/16 2144)  ondansetron (ZOFRAN) injection 4 mg (4 mg Intravenous Given 05/15/16 2043)     Initial Impression / Assessment and Plan / ED Course  I have reviewed the triage vital signs and the nursing notes.  Pertinent labs & imaging results that were available during my care of the patient were reviewed by me and considered in my medical decision making (see chart for details).     Will have patient follow up with OB at [facility]. Patient is otherwise stable, other than the vomiting.  She given IV fluids.  Told to return here as needed.  Patient agrees the plan and all questions were answered  Final Clinical Impressions(s) / ED Diagnoses   Final diagnoses:  None    New Prescriptions New Prescriptions   No medications on file     Charlestine Night, PA-C 05/16/16 0031    Charlestine Night, PA-C 05/16/16 0032    Nira Conn, MD 05/16/16 0040

## 2016-05-15 NOTE — ED Notes (Signed)
IV team at bedside 

## 2016-05-21 ENCOUNTER — Encounter (HOSPITAL_COMMUNITY): Payer: Self-pay

## 2016-05-21 ENCOUNTER — Inpatient Hospital Stay (HOSPITAL_COMMUNITY)

## 2016-05-21 ENCOUNTER — Inpatient Hospital Stay (HOSPITAL_COMMUNITY)
Admission: AD | Admit: 2016-05-21 | Discharge: 2016-05-22 | Disposition: A | Discharge status: Home / Self Care | Source: Ambulatory Visit | Attending: Obstetrics & Gynecology | Admitting: Obstetrics & Gynecology

## 2016-05-21 DIAGNOSIS — R109 Unspecified abdominal pain: Secondary | ICD-10-CM | POA: Diagnosis not present

## 2016-05-21 DIAGNOSIS — O26899 Other specified pregnancy related conditions, unspecified trimester: Secondary | ICD-10-CM

## 2016-05-21 DIAGNOSIS — O208 Other hemorrhage in early pregnancy: Secondary | ICD-10-CM | POA: Insufficient documentation

## 2016-05-21 DIAGNOSIS — Z3A01 Less than 8 weeks gestation of pregnancy: Secondary | ICD-10-CM | POA: Insufficient documentation

## 2016-05-21 DIAGNOSIS — R102 Pelvic and perineal pain: Secondary | ICD-10-CM | POA: Insufficient documentation

## 2016-05-21 DIAGNOSIS — O26891 Other specified pregnancy related conditions, first trimester: Secondary | ICD-10-CM | POA: Diagnosis not present

## 2016-05-21 DIAGNOSIS — O3481 Maternal care for other abnormalities of pelvic organs, first trimester: Secondary | ICD-10-CM | POA: Insufficient documentation

## 2016-05-21 LAB — URINALYSIS, ROUTINE W REFLEX MICROSCOPIC
Bilirubin Urine: NEGATIVE
Glucose, UA: NEGATIVE mg/dL
Hgb urine dipstick: NEGATIVE
Ketones, ur: NEGATIVE mg/dL
Nitrite: NEGATIVE
Protein, ur: NEGATIVE mg/dL
Specific Gravity, Urine: 1.008 (ref 1.005–1.030)
pH: 7 (ref 5.0–8.0)

## 2016-05-21 NOTE — MAU Note (Signed)
Pt c/o sharp, mid abdominal pain since 1600. Rates 5/10. Denies vaginal bleeding.

## 2016-05-22 DIAGNOSIS — O26899 Other specified pregnancy related conditions, unspecified trimester: Secondary | ICD-10-CM

## 2016-05-22 DIAGNOSIS — R109 Unspecified abdominal pain: Secondary | ICD-10-CM

## 2016-05-22 LAB — WET PREP, GENITAL
Clue Cells Wet Prep HPF POC: NONE SEEN
SPERM: NONE SEEN
Trich, Wet Prep: NONE SEEN
YEAST WET PREP: NONE SEEN

## 2016-05-22 LAB — GC/CHLAMYDIA PROBE AMP (~~LOC~~) NOT AT ARMC
Chlamydia: NEGATIVE
Neisseria Gonorrhea: NEGATIVE

## 2016-05-22 NOTE — Discharge Instructions (Signed)
Abdominal Pain, Adult Many things can cause belly (abdominal) pain. Most times, belly pain is not dangerous. Many cases of belly pain can be watched and treated at home. Sometimes belly pain is serious, though. Your doctor will try to find the cause of your belly pain. Follow these instructions at home:  Take over-the-counter and prescription medicines only as told by your doctor. Do not take medicines that help you poop (laxatives) unless told to by your doctor.  Drink enough fluid to keep your pee (urine) clear or pale yellow.  Watch your belly pain for any changes.  Keep all follow-up visits as told by your doctor. This is important. Contact a doctor if:  Your belly pain changes or gets worse.  You are not hungry, or you lose weight without trying.  You are having trouble pooping (constipated) or have watery poop (diarrhea) for more than 2-3 days.  You have pain when you pee or poop.  Your belly pain wakes you up at night.  Your pain gets worse with meals, after eating, or with certain foods.  You are throwing up and cannot keep anything down.  You have a fever. Get help right away if:  Your pain does not go away as soon as your doctor says it should.  You cannot stop throwing up.  Your pain is only in areas of your belly, such as the right side or the left lower part of the belly.  You have bloody or black poop, or poop that looks like tar.  You have very bad pain, cramping, or bloating in your belly.  You have signs of not having enough fluid or water in your body (dehydration), such as:  Dark pee, very little pee, or no pee.  Cracked lips.  Dry mouth.  Sunken eyes.  Sleepiness.  Weakness. This information is not intended to replace advice given to you by your health care provider. Make sure you discuss any questions you have with your health care provider. Document Released: 07/02/2007 Document Revised: 08/03/2015 Document Reviewed: 06/27/2015 Elsevier  Interactive Patient Education  2017 Else[employer] Inc.   Abdominal Pain During Pregnancy Belly (abdominal) pain is common during pregnancy. Most of the time, it is not a serious problem. Other times, it can be a sign that something is wrong with the pregnancy. Always tell your doctor if you have belly pain. Follow these instructions at home: Monitor your belly pain for any changes. The following actions may help you feel better:  Do not have sex (intercourse) or put anything in your vagina until you feel better.  Rest until your pain stops.  Drink clear fluids if you feel sick to your stomach (nauseous). Do not eat solid food until you feel better.  Only take medicine as told by your doctor.  Keep all doctor visits as told. Get help right away if:  You are bleeding, leaking fluid, or pieces of tissue come out of your vagina.  You have more pain or cramping.  You keep throwing up (vomiting).  You have pain when you pee (urinate) or have blood in your pee.  You have a fever.  You do not feel your baby moving as much.  You feel very weak or feel like passing out.  You have trouble breathing, with or without belly pain.  You have a very bad headache and belly pain.  You have fluid leaking from your vagina and belly pain.  You keep having watery poop (diarrhea).  Your belly pain does not  go away after resting, or the pain gets worse. This information is not intended to replace advice given to you by your health care provider. Make sure you discuss any questions you have with your health care provider. Document Released: 01/01/2009 Document Revised: 08/22/2015 Document Reviewed: 08/12/2012 Elsevier Interactive Patient Education  2017 [employer].  First Trimester of Pregnancy The first trimester of pregnancy is from week 1 until the end of week 13 (months 1 through 3). A week after a sperm fertilizes an egg, the egg will implant on the wall of the uterus. This embryo will begin  to develop into a baby. Genes from you and your partner will form the baby. The female genes will determine whether the baby will be a boy or a girl. At 6-8 weeks, the eyes and face will be formed, and the heartbeat can be seen on ultrasound. At the end of 12 weeks, all the baby's organs will be formed. Now that you are pregnant, you will want to do everything you can to have a healthy baby. Two of the most important things are to get good prenatal care and to follow your health care provider's instructions. Prenatal care is all the medical care you receive before the baby's birth. This care will help prevent, find, and treat any problems during the pregnancy and childbirth. Body changes during your first trimester Your body goes through many changes during pregnancy. The changes vary from woman to woman.  You may gain or lose a couple of pounds at first.  You may feel sick to your stomach (nauseous) and you may throw up (vomit). If the vomiting is uncontrollable, call your health care provider.  You may tire easily.  You may develop headaches that can be relieved by medicines. All medicines should be approved by your health care provider.  You may urinate more often. Painful urination may mean you have a bladder infection.  You may develop heartburn as a result of your pregnancy.  You may develop constipation because certain hormones are causing the muscles that push stool through your intestines to slow down.  You may develop hemorrhoids or swollen veins (varicose veins).  Your breasts may begin to grow larger and become tender. Your nipples may stick out more, and the tissue that surrounds them (areola) may become darker.  Your gums may bleed and may be sensitive to brushing and flossing.  Dark spots or blotches (chloasma, mask of pregnancy) may develop on your face. This will likely fade after the baby is born.  Your menstrual periods will stop.  You may have a loss of appetite.  You  may develop cravings for certain kinds of food.  You may have changes in your emotions from day to day, such as being excited to be pregnant or being concerned that something may go wrong with the pregnancy and baby.  You may have more vivid and strange dreams.  You may have changes in your hair. These can include thickening of your hair, rapid growth, and changes in texture. Some women also have hair loss during or after pregnancy, or hair that feels dry or thin. Your hair will most likely return to normal after your baby is born. What to expect at prenatal visits During a routine prenatal visit:  You will be weighed to make sure you and the baby are growing normally.  Your blood pressure will be taken.  Your abdomen will be measured to track your baby's growth.  The fetal heartbeat will be  listened to between weeks 10 and 14 of your pregnancy.  Test results from any previous visits will be discussed. Your health care provider may ask you:  How you are feeling.  If you are feeling the baby move.  If you have had any abnormal symptoms, such as leaking fluid, bleeding, severe headaches, or abdominal cramping.  If you are using any tobacco products, including cigarettes, chewing tobacco, and electronic cigarettes.  If you have any questions. Other tests that may be performed during your first trimester include:  Blood tests to find your blood type and to check for the presence of any previous infections. The tests will also be used to check for low iron levels (anemia) and protein on red blood cells (Rh antibodies). Depending on your risk factors, or if you previously had diabetes during pregnancy, you may have tests to check for high blood sugar that affects pregnant women (gestational diabetes).  Urine tests to check for infections, diabetes, or protein in the urine.  An ultrasound to confirm the proper growth and development of the baby.  Fetal screens for spinal cord problems  (spina bifida) and Down syndrome.  HIV (human immunodeficiency virus) testing. Routine prenatal testing includes screening for HIV, unless you choose not to have this test.  You may need other tests to make sure you and the baby are doing well. Follow these instructions at home: Medicines   Follow your health care provider's instructions regarding medicine use. Specific medicines may be either safe or unsafe to take during pregnancy.  Take a prenatal vitamin that contains at least 600 micrograms (mcg) of folic acid.  If you develop constipation, try taking a stool softener if your health care provider approves. Eating and drinking   Eat a balanced diet that includes fresh fruits and vegetables, whole grains, good sources of protein such as meat, eggs, or tofu, and low-fat dairy. Your health care provider will help you determine the amount of weight gain that is right for you.  Avoid raw meat and uncooked cheese. These carry germs that can cause birth defects in the baby.  Eating four or five small meals rather than three large meals a day may help relieve nausea and vomiting. If you start to feel nauseous, eating a few soda crackers can be helpful. Drinking liquids between meals, instead of during meals, also seems to help ease nausea and vomiting.  Limit foods that are high in fat and processed sugars, such as fried and sweet foods.  To prevent constipation:  Eat foods that are high in fiber, such as fresh fruits and vegetables, whole grains, and beans.  Drink enough fluid to keep your urine clear or pale yellow. Activity   Exercise only as directed by your health care provider. Most women can continue their usual exercise routine during pregnancy. Try to exercise for 30 minutes at least 5 days a week. Exercising will help you:  Control your weight.  Stay in shape.  Be prepared for labor and delivery.  Experiencing pain or cramping in the lower abdomen or lower back is a good  sign that you should stop exercising. Check with your health care provider before continuing with normal exercises.  Try to avoid standing for long periods of time. Move your legs often if you must stand in one place for a long time.  Avoid heavy lifting.  Wear low-heeled shoes and practice good posture.  You may continue to have sex unless your health care provider tells you not to.  Relieving pain and discomfort   Wear a good support bra to relieve breast tenderness.  Take warm sitz baths to soothe any pain or discomfort caused by hemorrhoids. Use hemorrhoid cream if your health care provider approves.  Rest with your legs elevated if you have leg cramps or low back pain.  If you develop varicose veins in your legs, wear support hose. Elevate your feet for 15 minutes, 3-4 times a day. Limit salt in your diet. Prenatal care   Schedule your prenatal visits by the twelfth week of pregnancy. They are usually scheduled monthly at first, then more often in the last 2 months before delivery.  Write down your questions. Take them to your prenatal visits.  Keep all your prenatal visits as told by your health care provider. This is important. Safety   Wear your seat belt at all times when driving.  Make a list of emergency phone numbers, including numbers for family, friends, the hospital, and police and fire departments. General instructions   Ask your health care provider for a referral to a local prenatal education class. Begin classes no later than the beginning of month 6 of your pregnancy.  Ask for help if you have counseling or nutritional needs during pregnancy. Your health care provider can offer advice or refer you to specialists for help with various needs.  Do not use hot tubs, steam rooms, or saunas.  Do not douche or use tampons or scented sanitary pads.  Do not cross your legs for long periods of time.  Avoid cat litter boxes and soil used by cats. These carry germs  that can cause birth defects in the baby and possibly loss of the fetus by miscarriage or stillbirth.  Avoid all smoking, herbs, alcohol, and medicines not prescribed by your health care provider. Chemicals in these products affect the formation and growth of the baby.  Do not use any products that contain nicotine or tobacco, such as cigarettes and e-cigarettes. If you need help quitting, ask your health care provider. You may receive counseling support and other resources to help you quit.  Schedule a dentist appointment. At home, brush your teeth with a soft toothbrush and be gentle when you floss. Contact a health care provider if:  You have dizziness.  You have mild pelvic cramps, pelvic pressure, or nagging pain in the abdominal area.  You have persistent nausea, vomiting, or diarrhea.  You have a bad smelling vaginal discharge.  You have pain when you urinate.  You notice increased swelling in your face, hands, legs, or ankles.  You are exposed to fifth disease or chickenpox.  You are exposed to [place] measles (rubella) and have never had it. Get help right away if:  You have a fever.  You are leaking fluid from your vagina.  You have spotting or bleeding from your vagina.  You have severe abdominal cramping or pain.  You have rapid weight gain or loss.  You vomit blood or material that looks like coffee grounds.  You develop a severe headache.  You have shortness of breath.  You have any kind of trauma, such as from a fall or a car accident. Summary  The first trimester of pregnancy is from week 1 until the end of week 13 (months 1 through 3).  Your body goes through many changes during pregnancy. The changes vary from woman to woman.  You will have routine prenatal visits. During those visits, your health care provider will examine you, discuss any  test results you may have, and talk with you about how you are feeling. This information is not intended to  replace advice given to you by your health care provider. Make sure you discuss any questions you have with your health care provider. Document Released: 01/07/2001 Document Revised: 12/26/2015 Document Reviewed: 12/26/2015 Elsevier Interactive Patient Education  2017 [employer].

## 2016-05-22 NOTE — MAU Provider Note (Signed)
Chief Complaint: Abdominal Pain   First Provider Initiated Contact with Patient 05/22/16 0024        SUBJECTIVE HPI: Annette Steele is a 21 y.o. G1P0 at [redacted]w[redacted]d by LMP who presents to maternity admissions reporting abdominal pain since 4pm.  Denies bleeding.  Pain is middle abdomen, just to left of umbilicus. She denies vaginal bleeding, vaginal itching/burning, urinary symptoms, h/a, dizziness, n/v, or fever/chills.    Was seen in ED on 05/15/16 for nausea and vomiting, and had a Quant HCG of 58,841    They did not do an ultrasound, however.  Abdominal Pain  This is a new problem. The current episode started today. The onset quality is gradual. The problem occurs intermittently. The problem has been gradually improving. The pain is located in the periumbilical region. The pain is mild. The quality of the pain is cramping. The abdominal pain does not radiate. Pertinent negatives include no constipation, diarrhea, dysuria, fever, headaches, nausea or vomiting. Nothing aggravates the pain. The pain is relieved by nothing. She has tried nothing for the symptoms. The treatment provided significant (States pain is gone now) relief.   RNnote: Pt c/o sharp, mid abdominal pain since 1600. Rates 5/10. Denies vaginal bleeding.   No past medical history on file. Past Surgical History:  Procedure Laterality Date  . ESOPHAGOGASTRODUODENOSCOPY     Social History   Social History  . Marital status: Married    Spouse name: N/A  . Number of children: N/A  . Years of education: N/A   Occupational History  . Not on file.   Social History Main Topics  . Smoking status: Never Smoker  . Smokeless tobacco: Never Used  . Alcohol use No  . Drug use: No  . Sexual activity: Not on file     Comment: One partner   Other Topics Concern  . Not on file   Social History Narrative  . No narrative on file   No current facility-administered medications on file prior to encounter.    Current Outpatient  Prescriptions on File Prior to Encounter  Medication Sig Dispense Refill  . cetirizine (ZYRTEC) 10 MG tablet Take 10 mg by mouth daily as needed for allergies.    Marland Kitchen omeprazole (PRILOSEC) 20 MG capsule Take 1 capsule (20 mg total) by mouth daily. 30 capsule 0   No Known Allergies  I have reviewed patient's Past Medical Hx, Surgical Hx, Family Hx, Social Hx, medications and allergies.   ROS:  Review of Systems  Constitutional: Negative for fever.  Gastrointestinal: Positive for abdominal pain. Negative for constipation, diarrhea, nausea and vomiting.  Genitourinary: Negative for dysuria.  Neurological: Negative for headaches.   Review of Systems  Other systems negative   Physical Exam  Physical Exam Patient Vitals for the past 24 hrs:  BP Temp Pulse Resp SpO2 Height Weight  05/21/16 1959 127/73 97.9 F (36.6 C) 78 16 100 %  (1.651 m) 167 lb (75.8 kg)   Constitutional: Well-developed, well-nourished female in no acute distress.  Cardiovascular: normal rate Respiratory: normal effort GI: Abd soft, non-tender. Pos BS x 4 MS: Extremities nontender, no edema, normal ROM Neurologic: Alert and oriented x 4.  GU: Neg CVAT.  PELVIC EXAM: Cervix pink, visually closed, without lesion, scant white creamy discharge, vaginal walls and external genitalia normal Bimanual exam: Cervix 0/long/high, firm, anterior, neg CMT, uterus nontender, nonenlarged, adnexa without tenderness, enlargement, or mass   LAB RESULTS Results for orders placed or performed during the hospital encounter of 05/21/16 (  from the past 24 hour(s))  Urinalysis, Routine w reflex microscopic     Status: Abnormal   Collection Time: 05/21/16  7:57 PM  Result Value Ref Range   Color, Urine STRAW (A) YELLOW   APPearance HAZY (A) CLEAR   Specific Gravity, Urine 1.008 1.005 - 1.030   pH 7.0 5.0 - 8.0   Glucose, UA NEGATIVE NEGATIVE mg/dL   Hgb urine dipstick NEGATIVE NEGATIVE   Bilirubin Urine NEGATIVE NEGATIVE    Ketones, ur NEGATIVE NEGATIVE mg/dL   Protein, ur NEGATIVE NEGATIVE mg/dL   Nitrite NEGATIVE NEGATIVE   Leukocytes, UA TRACE (A) NEGATIVE   RBC / HPF 0-5 0 - 5 RBC/hpf   WBC, UA 0-5 0 - 5 WBC/hpf   Bacteria, UA RARE (A) NONE SEEN   Squamous Epithelial / LPF 0-5 (A) NONE SEEN   Mucous PRESENT   Wet prep, genital     Status: Abnormal   Collection Time: 05/22/16 12:00 AM  Result Value Ref Range   Yeast Wet Prep HPF POC NONE SEEN NONE SEEN   Trich, Wet Prep NONE SEEN NONE SEEN   Clue Cells Wet Prep HPF POC NONE SEEN NONE SEEN   WBC, Wet Prep HPF POC FEW (A) NONE SEEN   Sperm NONE SEEN     IMAGING US Ob Comp Less 14 Wks  Result Date: 05/21/2016 CLINICAL DATA:  Initial evaluation for acute pelvic pain affecting first trimester pregnancy. Beta HCG on 05/15/2016 the bulb 58,000, 841 EXAM: OBSTETRIC <14 WK [place] AND TRANSVAGINAL OB US TECHNIQUE: Both transabdominal and transvaginal ultrasound examinations were performed for complete evaluation of the gestation as well as the maternal uterus, adnexal regions, and pelvic cul-de-sac. Transvaginal technique was performed to assess early pregnancy. COMPARISON:  None. FINDINGS: Intrauterine gestational sac: Single Yolk sac:  Present Embryo:  Present Cardiac Activity: Present Heart Rate: 159  bpm MSD:   mm    w     d CRL:  14.6  mm   7 w   5 d                  [place] EDC: 01/02/2017 Subchorionic hemorrhage: Small subchorionic hemorrhage measuring 2.1 x 1.2 x 1.4 cm. No significant mass effect on the gestational sac. Maternal uterus/adnexae: Left ovary well-visualized and is normal in appearance. Right ovary well-visualized and within normal limits. Small right ovarian corpus luteal cyst noted. Trace free fluid within the pelvis. IMPRESSION: 1. Single viable intrauterine pregnancy as above. 2. Small subchorionic hemorrhage measuring 2.1 x 1.2 x 1.4 cm. 3. Right ovarian corpus luteal cyst. 4. No other acute abnormality within the pelvis. Electronically Signed   By:  Rise Mu M.D.   On: 05/21/2016 23:50   US Ob Transvaginal  Result Date: 05/21/2016 CLINICAL DATA:  Initial evaluation for acute pelvic pain affecting first trimester pregnancy. Beta HCG on 05/15/2016 the bulb 58,000, 841 EXAM: OBSTETRIC <14 WK [place] AND TRANSVAGINAL OB US TECHNIQUE: Both transabdominal and transvaginal ultrasound examinations were performed for complete evaluation of the gestation as well as the maternal uterus, adnexal regions, and pelvic cul-de-sac. Transvaginal technique was performed to assess early pregnancy. COMPARISON:  None. FINDINGS: Intrauterine gestational sac: Single Yolk sac:  Present Embryo:  Present Cardiac Activity: Present Heart Rate: 159  bpm MSD:   mm    w     d CRL:  14.6  mm   7 w   5 d  [place] EDC: 01/02/2017 Subchorionic hemorrhage: Small subchorionic hemorrhage measuring 2.1 x 1.2 x 1.4 cm. No significant mass effect on the gestational sac. Maternal uterus/adnexae: Left ovary well-visualized and is normal in appearance. Right ovary well-visualized and within normal limits. Small right ovarian corpus luteal cyst noted. Trace free fluid within the pelvis. IMPRESSION: 1. Single viable intrauterine pregnancy as above. 2. Small subchorionic hemorrhage measuring 2.1 x 1.2 x 1.4 cm. 3. Right ovarian corpus luteal cyst. 4. No other acute abnormality within the pelvis. Electronically Signed   By: Rise Mu M.D.   On: 05/21/2016 23:50    MAU Management/MDM: Will check baseline Ultrasound to rule out ectopic.  >> US showed single IUP, no ectopic                                                                                         Discussed small subchorionic hematoma  This bleeding/pain can represent a normal pregnancy with bleeding, spontaneous abortion or even an ectopic which can be life-threatening.  The process as listed above helps to determine which of these is present.    ASSESSMENT 1. Pelvic pain affecting pregnancy in first  trimester, antepartum   2. Pelvic pain affecting pregnancy in first trimester, antepartum   3.      Live single intrauterine pregnancy  PLAN Discharge home Encouraged to seek prenatal care   Pt stable at time of discharge. Encouraged to return here or to other Urgent Care/ED if she develops worsening of symptoms, increase in pain, fever, or other concerning symptoms.    Wynelle Bourgeois CNM, MSN Certified Nurse-Midwife 05/22/2016  12:25 AM

## 2016-06-04 ENCOUNTER — Encounter (HOSPITAL_COMMUNITY): Payer: Self-pay | Admitting: Family Medicine

## 2016-06-04 ENCOUNTER — Ambulatory Visit (HOSPITAL_COMMUNITY)
Admission: EM | Admit: 2016-06-04 | Discharge: 2016-06-04 | Disposition: A | Discharge status: Home / Self Care | Attending: Internal Medicine | Admitting: Internal Medicine

## 2016-06-04 DIAGNOSIS — R51 Headache: Secondary | ICD-10-CM | POA: Diagnosis not present

## 2016-06-04 DIAGNOSIS — L731 Pseudofolliculitis barbae: Secondary | ICD-10-CM | POA: Diagnosis not present

## 2016-06-04 DIAGNOSIS — Z3A10 10 weeks gestation of pregnancy: Secondary | ICD-10-CM

## 2016-06-04 DIAGNOSIS — Z331 Pregnant state, incidental: Secondary | ICD-10-CM | POA: Diagnosis not present

## 2016-06-04 DIAGNOSIS — R519 Headache, unspecified: Secondary | ICD-10-CM

## 2016-06-04 NOTE — Discharge Instructions (Signed)
Apply warm compresses to the small bump on the right thigh at least 3 times a day. As above, if it becomes larger, more painful, increased in size and redness and looking worse seek medical attention promptly. May return. If your headache gets worse or you develop symptoms as discussed, anything abnormal, confusion, disorientation, double vision, trouble with speech, hearing, swallowing, movement, weakness or worsening headache To the emergency department promptly.

## 2016-06-04 NOTE — ED Provider Notes (Signed)
CSN: [id number]     Arrival date & time 06/04/16  1043 History   First MD Initiated Contact with Patient 06/04/16 1202     Chief Complaint  Patient presents with  . Abscess   (Consider location/radiation/quality/duration/timing/severity/associated sxs/prior Treatment) 21 year old female states she is approximately [redacted] weeks gestation complains of a tender bump to the right posterior mid thigh. Started a couple days ago.  Other complaint is a headache, throbbing and generalized. She does not have a history of headaches. Denies associated neurologic symptoms.      History reviewed. No pertinent past medical history. Past Surgical History:  Procedure Laterality Date  . ESOPHAGOGASTRODUODENOSCOPY     History reviewed. No pertinent family history. Social History  Substance Use Topics  . Smoking status: Never Smoker  . Smokeless tobacco: Never Used  . Alcohol use No   OB History    Gravida Para Term Preterm AB Living   1             SAB TAB Ectopic Multiple Live Births                 Review of Systems  Constitutional: Negative.   HENT: Negative.   Respiratory: Negative.   Gastrointestinal: Negative.   Skin:       Small tender skin lesion to right posterior thigh.  Neurological: Positive for headaches. Negative for dizziness, tremors, seizures, syncope, facial asymmetry, speech difficulty, light-headedness and numbness.  Psychiatric/Behavioral: Negative.   All other systems reviewed and are negative.   Allergies  Patient has no known allergies.  Home Medications   Prior to Admission medications   Medication Sig Start Date End Date Taking? Authorizing Provider  cetirizine (ZYRTEC) 10 MG tablet Take 10 mg by mouth daily as needed for allergies.    [provider]  omeprazole (PRILOSEC) 20 MG capsule Take 1 capsule (20 mg total) by mouth daily. 04/09/16   Hedges, Tinnie Gens, PA-C   Meds Ordered and Administered this Visit  Medications - No data to display  BP  119/70   Pulse 70   Temp 97 F (36.1 C)   Resp 18   LMP 04/01/2016 (Approximate)   SpO2 98%  No data found.   Physical Exam  Constitutional: She is oriented to person, place, and time. She appears well-developed and well-nourished. No distress.  HENT:  Head: Normocephalic and atraumatic.  Neck: Neck supple.  Cardiovascular: Normal rate and regular rhythm.   Pulmonary/Chest: Effort normal. No respiratory distress.  Musculoskeletal: She exhibits no edema or deformity.  Neurological: She is alert and oriented to person, place, and time. No cranial nerve deficit or sensory deficit. She exhibits normal muscle tone. Coordination normal.  Skin: Skin is warm and dry.  There is a 1 cm slightly rectangular lesion with mild redness and tenderness located to the right posterior mid thigh. There is a very faint erythema extending around the central lesion extending approximately 1 cm. The mid area is thickened. No extending induration. No lymphangitis. No drainage, bleeding or open wound.  Psychiatric: She has a normal mood and affect.  Nursing note and vitals reviewed.   Urgent Care Course     Procedures (including critical care time)  Labs Review Labs Reviewed - No data to display  Imaging Review No results found.   Visual Acuity Review  Right Eye Distance:   Left Eye Distance:   Bilateral Distance:    Right Eye Near:   Left Eye Near:    Bilateral Near:  MDM   1. Ingrown hair   2. Acute nonintractable headache, unspecified headache type   3. [redacted] weeks gestation of pregnancy    The differential of the lesion to the posterior thigh is insect bite, ingrown hair or localized skin infection. Prefer not to start antibiotics at this time since this may clear with warm compresses and she is pregnant. Apply warm compresses to the small bump on the right thigh at least 3 times a day. As above, if it becomes larger, more painful, increased in size and redness and looking  worse seek medical attention promptly. May return. If your headache gets worse or you develop symptoms as discussed, anything abnormal, confusion, disorientation, double vision, trouble with speech, hearing, swallowing, movement, weakness or worsening headache To the emergency department promptly.     Hayden RasmussenMabe, Isair Inabinet, NP 06/04/16 1226

## 2016-06-04 NOTE — ED Triage Notes (Signed)
Pt here for abscess to the back of right thigh. sts also headache.

## 2016-06-09 ENCOUNTER — Ambulatory Visit (INDEPENDENT_AMBULATORY_CARE_PROVIDER_SITE_OTHER): Admitting: Family Medicine

## 2016-06-09 ENCOUNTER — Ambulatory Visit: Payer: Self-pay

## 2016-06-09 ENCOUNTER — Encounter: Payer: Self-pay | Admitting: Family Medicine

## 2016-06-09 ENCOUNTER — Ambulatory Visit: Payer: Self-pay | Admitting: Clinical

## 2016-06-09 VITALS — BP 125/70 | HR 78 | Wt 162.5 lb

## 2016-06-09 DIAGNOSIS — Z3481 Encounter for supervision of other normal pregnancy, first trimester: Secondary | ICD-10-CM

## 2016-06-09 DIAGNOSIS — Z124 Encounter for screening for malignant neoplasm of cervix: Secondary | ICD-10-CM | POA: Diagnosis not present

## 2016-06-09 DIAGNOSIS — O3680X0 Pregnancy with inconclusive fetal viability, not applicable or unspecified: Secondary | ICD-10-CM | POA: Diagnosis not present

## 2016-06-09 DIAGNOSIS — F4323 Adjustment disorder with mixed anxiety and depressed mood: Secondary | ICD-10-CM

## 2016-06-09 DIAGNOSIS — Z3480 Encounter for supervision of other normal pregnancy, unspecified trimester: Secondary | ICD-10-CM | POA: Insufficient documentation

## 2016-06-09 LAB — POCT URINALYSIS DIP (DEVICE)
Bilirubin Urine: NEGATIVE
Glucose, UA: NEGATIVE mg/dL
Ketones, ur: NEGATIVE mg/dL
Nitrite: NEGATIVE
Protein, ur: NEGATIVE mg/dL
Specific Gravity, Urine: 1.025 (ref 1.005–1.030)
Urobilinogen, UA: 1 mg/dL (ref 0.0–1.0)
pH: 6.5 (ref 5.0–8.0)

## 2016-06-09 LAB — OB RESULTS CONSOLE GBS: GBS: POSITIVE

## 2016-06-09 NOTE — BH Specialist Note (Signed)
Integrated Behavioral Health Initial Visit  MRN: [id number] Name: Annette Steele   Session Start time: 10:55 Session End time: 11:05 Total time: 10 minutes  Type of Service: Integrated Behavioral Health- Individual/Family Interpretor:No. Interpretor Name and Language: n/a   Warm Hand Off Completed.       SUBJECTIVE: Annette Steele is a 21 y.o. female accompanied by patient and FOB. Patient was referred by Dr. Shawnie PonsPratt  for anxiety, depression. Patient reports the following symptoms/concerns: Pt primary symptoms include sleep difficulty, low energy, worry, and irritability; pt attributes symptoms to early pregnancy, and is open to educational material to learn how to prevent increase in symptoms this pregnancy. Duration of problem: < 2 months; Severity of problem: mild  OBJECTIVE: Mood: Appropriate and Affect: Appropriate Risk of harm to self or others: No plan to harm self or others   LIFE CONTEXT: Family and Social: Lives with husband and 23 year old School/Work: - Self-Care: - Life Changes: Current pregnancy  GOALS ADDRESSED: Patient will reduce symptoms of: anxiety and depression and increase knowledge and/or ability of: coping skills and also: Increase healthy adjustment to current life circumstances   INTERVENTIONS: Supportive Counseling and Psychoeducation and/or Health Education  Standardized Assessments completed: GAD-7 and PHQ 9  ASSESSMENT: Patient currently experiencing Adjustment disorder with mixed anxious and depressed mood. Patient may benefit from psychoeducation and brief therapeutic intervention regarding coping with symptoms of anxiety and depression.  PLAN: 1. Follow up with behavioral health clinician on : As needed 2. Behavioral recommendations:  -Read educational material regarding coping with symptoms of anxiety and depression 3. Referral(s): [facility]  Valetta CloseJamie C MundeleinMcMannes, [place]CSWA  Depression screen [facility] 2/9  06/09/2016  Decreased Interest 2  Down, Depressed, Hopeless 0  PHQ - 2 Score 2  Altered sleeping 3  Tired, decreased energy 3  Change in appetite 2  Feeling bad or failure about yourself  0  Trouble concentrating 0  Moving slowly or fidgety/restless 1  Suicidal thoughts 0  PHQ-9 Score 11   GAD 7 : Generalized Anxiety Score 06/09/2016  Nervous, Anxious, on Edge 0  Control/stop worrying 3  Worry too much - different things 2  Trouble relaxing 2  Restless 0  Easily annoyed or irritable 3  Afraid - awful might happen 1  Total GAD 7 Score 11

## 2016-06-09 NOTE — Progress Notes (Signed)
Pt informed that the ultrasound is considered a limited OB ultrasound and is not intended to be a complete ultrasound exam.  Patient also informed that the ultrasound is not being completed with the intent of assessing for fetal or placental anomalies or any pelvic abnormalities.  Explained that the purpose of today's ultrasound is to assess for viability.  Patient acknowledges the purpose of the exam and the limitations of the study.    Single IUP FHR - 168 bpm per M-mode FM present Dr. Shawnie PonsPratt notified

## 2016-06-09 NOTE — Progress Notes (Signed)
Last Pap Smear was in [employer] school was positive for HPV Baby Scripts? Offer flu today New Ob packet given Need to see Annette MuirJamie if patient agreeable

## 2016-06-09 NOTE — Progress Notes (Signed)
   Subjective:    Annette Steele is a G2P1001 1543w6d being seen today for her first obstetrical visit.  Her obstetrical history is not significant. Pregnancy history fully reviewed.  Patient reports headache and no bleeding.  Vitals:   06/09/16 1005  BP: 125/70  Pulse: 78  Weight: 162 lb 8 oz (73.7 kg)    HISTORY: OB History  Gravida Para Term Preterm AB Living  2 1 1     1   SAB TAB Ectopic Multiple Live Births          1    # Outcome Date GA Lbr Len/2nd Weight Sex Delivery Anes PTL Lv  2 Current           1 Term 04/08/12   7 lb (3.175 kg) M Vag-Spont  N LIV     History reviewed. No pertinent past medical history. Past Surgical History:  Procedure Laterality Date  . ESOPHAGOGASTRODUODENOSCOPY     Family History  Problem Relation Age of Onset  . Diabetes Father   . Diabetes Sister      Exam    Uterus:     Pelvic Exam:    Perineum: Normal Perineum   Vulva: Bartholin's, Urethra, Skene's normal   Vagina:  normal mucosa, normal discharge   Cervix: multiparous appearance and no lesions   Adnexa: normal adnexa   Bony Pelvis: average  System: Breast:  normal appearance, no masses or tenderness   Skin: normal coloration and turgor, no rashes    Neurologic: oriented   Extremities: normal strength, tone, and muscle mass   HEENT extra ocular movement intact and sclera clear, anicteric   Mouth/Teeth mucous membranes moist, pharynx normal without lesions   Neck supple   Cardiovascular: regular rate and rhythm, no murmurs or gallops   Respiratory:  appears well, vitals normal, no respiratory distress, acyanotic, normal RR, ear and throat exam is normal, neck free of mass or lymphadenopathy, chest clear, no wheezing, crepitations, rhonchi, normal symmetric air entry   Abdomen: soft, non-tender; bowel sounds normal; no masses,  no organomegaly     + FHR on limited OB u/s Assessment/Plan:    Pregnancy: G2P1001 1. Supervision of other normal pregnancy, antepartum -  Prenatal Profile I - HIV antibody - Hemoglobinopathy Evaluation - Cytology - PAP - Culture, OB Urine - US MFM Fetal Nuchal Translucency; Future  2. Encounter to determine fetal viability of pregnancy, single or unspecified fetus - US OB Limited  Reva Boresanya S Havish Petties 06/09/2016

## 2016-06-09 NOTE — Patient Instructions (Addendum)
For colds and allergies  Any anti-histamine including benadryl, allegra, claritin, etc.  Sudafed but not phenylephrine  Mucinex  Robitussin  For Reflux/heartburn  Pepcid Zantac Tums Prilosec Prevacid  For yeast infections  Monistat  For constipation  Colace  For minor aches and pains  Tylenol-do not take more than 4000mg  in 24 hours. Therma-care or like heat packs   First Trimester of Pregnancy The first trimester of pregnancy is from week 1 until the end of week 13 (months 1 through 3). A week after a sperm fertilizes an egg, the egg will implant on the wall of the uterus. This embryo will begin to develop into a baby. Genes from you and your partner will form the baby. The female genes will determine whether the baby will be a boy or a girl. At 6-8 weeks, the eyes and face will be formed, and the heartbeat can be seen on ultrasound. At the end of 12 weeks, all the baby's organs will be formed. Now that you are pregnant, you will want to do everything you can to have a healthy baby. Two of the most important things are to get good prenatal care and to follow your health care provider's instructions. Prenatal care is all the medical care you receive before the baby's birth. This care will help prevent, find, and treat any problems during the pregnancy and childbirth. Body changes during your first trimester Your body goes through many changes during pregnancy. The changes vary from woman to woman.  You may gain or lose a couple of pounds at first.  You may feel sick to your stomach (nauseous) and you may throw up (vomit). If the vomiting is uncontrollable, call your health care provider.  You may tire easily.  You may develop headaches that can be relieved by medicines. All medicines should be approved by your health care provider.  You may urinate more often. Painful urination may mean you have a bladder infection.  You may develop heartburn as a result of your  pregnancy.  You may develop constipation because certain hormones are causing the muscles that push stool through your intestines to slow down.  You may develop hemorrhoids or swollen veins (varicose veins).  Your breasts may begin to grow larger and become tender. Your nipples may stick out more, and the tissue that surrounds them (areola) may become darker.  Your gums may bleed and may be sensitive to brushing and flossing.  Dark spots or blotches (chloasma, mask of pregnancy) may develop on your face. This will likely fade after the baby is born.  Your menstrual periods will stop.  You may have a loss of appetite.  You may develop cravings for certain kinds of food.  You may have changes in your emotions from day to day, such as being excited to be pregnant or being concerned that something may go wrong with the pregnancy and baby.  You may have more vivid and strange dreams.  You may have changes in your hair. These can include thickening of your hair, rapid growth, and changes in texture. Some women also have hair loss during or after pregnancy, or hair that feels dry or thin. Your hair will most likely return to normal after your baby is born. What to expect at prenatal visits During a routine prenatal visit:  You will be weighed to make sure you and the baby are growing normally.  Your blood pressure will be taken.  Your abdomen will be measured to track your baby's  growth.  The fetal heartbeat will be listened to between weeks 10 and 14 of your pregnancy.  Test results from any previous visits will be discussed. Your health care provider may ask you:  How you are feeling.  If you are feeling the baby move.  If you have had any abnormal symptoms, such as leaking fluid, bleeding, severe headaches, or abdominal cramping.  If you are using any tobacco products, including cigarettes, chewing tobacco, and electronic cigarettes.  If you have any questions. Other tests  that may be performed during your first trimester include:  Blood tests to find your blood type and to check for the presence of any previous infections. The tests will also be used to check for low iron levels (anemia) and protein on red blood cells (Rh antibodies). Depending on your risk factors, or if you previously had diabetes during pregnancy, you may have tests to check for high blood sugar that affects pregnant women (gestational diabetes).  Urine tests to check for infections, diabetes, or protein in the urine.  An ultrasound to confirm the proper growth and development of the baby.  Fetal screens for spinal cord problems (spina bifida) and Down syndrome.  HIV (human immunodeficiency virus) testing. Routine prenatal testing includes screening for HIV, unless you choose not to have this test.  You may need other tests to make sure you and the baby are doing well. Follow these instructions at home: Medicines   Follow your health care provider's instructions regarding medicine use. Specific medicines may be either safe or unsafe to take during pregnancy.  Take a prenatal vitamin that contains at least 600 micrograms (mcg) of folic acid.  If you develop constipation, try taking a stool softener if your health care provider approves. Eating and drinking   Eat a balanced diet that includes fresh fruits and vegetables, whole grains, good sources of protein such as meat, eggs, or tofu, and low-fat dairy. Your health care provider will help you determine the amount of weight gain that is right for you.  Avoid raw meat and uncooked cheese. These carry germs that can cause birth defects in the baby.  Eating four or five small meals rather than three large meals a day may help relieve nausea and vomiting. If you start to feel nauseous, eating a few soda crackers can be helpful. Drinking liquids between meals, instead of during meals, also seems to help ease nausea and vomiting.  Limit foods  that are high in fat and processed sugars, such as fried and sweet foods.  To prevent constipation:  Eat foods that are high in fiber, such as fresh fruits and vegetables, whole grains, and beans.  Drink enough fluid to keep your urine clear or pale yellow. Activity   Exercise only as directed by your health care provider. Most women can continue their usual exercise routine during pregnancy. Try to exercise for 30 minutes at least 5 days a week. Exercising will help you:  Control your weight.  Stay in shape.  Be prepared for labor and delivery.  Experiencing pain or cramping in the lower abdomen or lower back is a good sign that you should stop exercising. Check with your health care provider before continuing with normal exercises.  Try to avoid standing for long periods of time. Move your legs often if you must stand in one place for a long time.  Avoid heavy lifting.  Wear low-heeled shoes and practice good posture.  You may continue to have sex unless your  health care provider tells you not to. Relieving pain and discomfort   Wear a good support bra to relieve breast tenderness.  Take warm sitz baths to soothe any pain or discomfort caused by hemorrhoids. Use hemorrhoid cream if your health care provider approves.  Rest with your legs elevated if you have leg cramps or low back pain.  If you develop varicose veins in your legs, wear support hose. Elevate your feet for 15 minutes, 3-4 times a day. Limit salt in your diet. Prenatal care   Schedule your prenatal visits by the twelfth week of pregnancy. They are usually scheduled monthly at first, then more often in the last 2 months before delivery.  Write down your questions. Take them to your prenatal visits.  Keep all your prenatal visits as told by your health care provider. This is important. Safety   Wear your seat belt at all times when driving.  Make a list of emergency phone numbers, including numbers for  family, friends, the hospital, and police and fire departments. General instructions   Ask your health care provider for a referral to a local prenatal education class. Begin classes no later than the beginning of month 6 of your pregnancy.  Ask for help if you have counseling or nutritional needs during pregnancy. Your health care provider can offer advice or refer you to specialists for help with various needs.  Do not use hot tubs, steam rooms, or saunas.  Do not douche or use tampons or scented sanitary pads.  Do not cross your legs for long periods of time.  Avoid cat litter boxes and soil used by cats. These carry germs that can cause birth defects in the baby and possibly loss of the fetus by miscarriage or stillbirth.  Avoid all smoking, herbs, alcohol, and medicines not prescribed by your health care provider. Chemicals in these products affect the formation and growth of the baby.  Do not use any products that contain nicotine or tobacco, such as cigarettes and e-cigarettes. If you need help quitting, ask your health care provider. You may receive counseling support and other resources to help you quit.  Schedule a dentist appointment. At home, brush your teeth with a soft toothbrush and be gentle when you floss. Contact a health care provider if:  You have dizziness.  You have mild pelvic cramps, pelvic pressure, or nagging pain in the abdominal area.  You have persistent nausea, vomiting, or diarrhea.  You have a bad smelling vaginal discharge.  You have pain when you urinate.  You notice increased swelling in your face, hands, legs, or ankles.  You are exposed to fifth disease or chickenpox.  You are exposed to [place] measles (rubella) and have never had it. Get help right away if:  You have a fever.  You are leaking fluid from your vagina.  You have spotting or bleeding from your vagina.  You have severe abdominal cramping or pain.  You have rapid weight  gain or loss.  You vomit blood or material that looks like coffee grounds.  You develop a severe headache.  You have shortness of breath.  You have any kind of trauma, such as from a fall or a car accident. Summary  The first trimester of pregnancy is from week 1 until the end of week 13 (months 1 through 3).  Your body goes through many changes during pregnancy. The changes vary from woman to woman.  You will have routine prenatal visits. During those visits, your  health care provider will examine you, discuss any test results you may have, and talk with you about how you are feeling. This information is not intended to replace advice given to you by your health care provider. Make sure you discuss any questions you have with your health care provider. Document Released: 01/07/2001 Document Revised: 12/26/2015 Document Reviewed: 12/26/2015 Elsevier Interactive Patient Education  2017 [employer].   Breastfeeding Deciding to breastfeed is one of the best choices you can make for you and your baby. A change in hormones during pregnancy causes your breast tissue to grow and increases the number and size of your milk ducts. These hormones also allow proteins, sugars, and fats from your blood supply to make breast milk in your milk-producing glands. Hormones prevent breast milk from being released before your baby is born as well as prompt milk flow after birth. Once breastfeeding has begun, thoughts of your baby, as well as his or her sucking or crying, can stimulate the release of milk from your milk-producing glands. Benefits of breastfeeding For Your Baby  Your first milk (colostrum) helps your baby's digestive system function better.  There are antibodies in your milk that help your baby fight off infections.  Your baby has a lower incidence of asthma, allergies, and sudden infant death syndrome.  The nutrients in breast milk are better for your baby than infant formulas and are  designed uniquely for your baby's needs.  Breast milk improves your baby's brain development.  Your baby is less likely to develop other conditions, such as childhood obesity, asthma, or type 2 diabetes mellitus. For You  Breastfeeding helps to create a very special bond between you and your baby.  Breastfeeding is convenient. Breast milk is always available at the correct temperature and costs nothing.  Breastfeeding helps to burn calories and helps you lose the weight gained during pregnancy.  Breastfeeding makes your uterus contract to its prepregnancy size faster and slows bleeding (lochia) after you give birth.  Breastfeeding helps to lower your risk of developing type 2 diabetes mellitus, osteoporosis, and breast or ovarian cancer later in life. Signs that your baby is hungry Early Signs of Hunger  Increased alertness or activity.  Stretching.  Movement of the head from side to side.  Movement of the head and opening of the mouth when the corner of the mouth or cheek is stroked (rooting).  Increased sucking sounds, smacking lips, cooing, sighing, or squeaking.  Hand-to-mouth movements.  Increased sucking of fingers or hands. Late Signs of Hunger  Fussing.  Intermittent crying. Extreme Signs of Hunger  Signs of extreme hunger will require calming and consoling before your baby will be able to breastfeed successfully. Do not wait for the following signs of extreme hunger to occur before you initiate breastfeeding:  Restlessness.  A loud, strong cry.  Screaming. Breastfeeding basics  Breastfeeding Initiation  Find a comfortable place to sit or lie down, with your neck and back well supported.  Place a pillow or rolled up blanket under your baby to bring him or her to the level of your breast (if you are seated). Nursing pillows are specially designed to help support your arms and your baby while you breastfeed.  Make sure that your baby's abdomen is facing your  abdomen.  Gently massage your breast. With your fingertips, massage from your chest wall toward your nipple in a circular motion. This encourages milk flow. You may need to continue this action during the feeding if your milk  flows slowly.  Support your breast with 4 fingers underneath and your thumb above your nipple. Make sure your fingers are well away from your nipple and your baby's mouth.  Stroke your baby's lips gently with your finger or nipple.  When your baby's mouth is open wide enough, quickly bring your baby to your breast, placing your entire nipple and as much of the colored area around your nipple (areola) as possible into your baby's mouth.  More areola should be visible above your baby's upper lip than below the lower lip.  Your baby's tongue should be between his or her lower gum and your breast.  Ensure that your baby's mouth is correctly positioned around your nipple (latched). Your baby's lips should create a seal on your breast and be turned out (everted).  It is common for your baby to suck about 2-3 minutes in order to start the flow of breast milk. Latching  Teaching your baby how to latch on to your breast properly is very important. An improper latch can cause nipple pain and decreased milk supply for you and poor weight gain in your baby. Also, if your baby is not latched onto your nipple properly, he or she may swallow some air during feeding. This can make your baby fussy. Burping your baby when you switch breasts during the feeding can help to get rid of the air. However, teaching your baby to latch on properly is still the best way to prevent fussiness from swallowing air while breastfeeding. Signs that your baby has successfully latched on to your nipple:  Silent tugging or silent sucking, without causing you pain.  Swallowing heard between every 3-4 sucks.  Muscle movement above and in front of his or her ears while sucking. Signs that your baby has not  successfully latched on to nipple:  Sucking sounds or smacking sounds from your baby while breastfeeding.  Nipple pain. If you think your baby has not latched on correctly, slip your finger into the corner of your baby's mouth to break the suction and place it between your baby's gums. Attempt breastfeeding initiation again. Signs of Successful Breastfeeding  Signs from your baby:  A gradual decrease in the number of sucks or complete cessation of sucking.  Falling asleep.  Relaxation of his or her body.  Retention of a small amount of milk in his or her mouth.  Letting go of your breast by himself or herself. Signs from you:  Breasts that have increased in firmness, weight, and size 1-3 hours after feeding.  Breasts that are softer immediately after breastfeeding.  Increased milk volume, as well as a change in milk consistency and color by the fifth day of breastfeeding.  Nipples that are not sore, cracked, or bleeding. Signs That Your Pecola LeisureBaby is Getting Enough Milk  Wetting at least 1-2 diapers during the first 24 hours after birth.  Wetting at least 5-6 diapers every 24 hours for the first week after birth. The urine should be clear or pale yellow by 5 days after birth.  Wetting 6-8 diapers every 24 hours as your baby continues to grow and develop.  At least 3 stools in a 24-hour period by age 670 days. The stool should be soft and yellow.  At least 3 stools in a 24-hour period by age 67 days. The stool should be seedy and yellow.  No loss of weight greater than 10% of birth weight during the first 603 days of age.  Average weight gain of 4-7  ounces (113-198 g) per week after age 90 days.  Consistent daily weight gain by age 71 days, without weight loss after the age of 2 weeks. After a feeding, your baby may spit up a small amount. This is common. Breastfeeding frequency and duration Frequent feeding will help you make more milk and can prevent sore nipples and breast  engorgement. Breastfeed when you feel the need to reduce the fullness of your breasts or when your baby shows signs of hunger. This is called "breastfeeding on demand." Avoid introducing a pacifier to your baby while you are working to establish breastfeeding (the first 4-6 weeks after your baby is born). After this time you may choose to use a pacifier. Research has shown that pacifier use during the first year of a baby's life decreases the risk of sudden infant death syndrome (SIDS). Allow your baby to feed on each breast as long as he or she wants. Breastfeed until your baby is finished feeding. When your baby unlatches or falls asleep while feeding from the first breast, offer the second breast. Because newborns are often sleepy in the first few weeks of life, you may need to awaken your baby to get him or her to feed. Breastfeeding times will vary from baby to baby. However, the following rules can serve as a guide to help you ensure that your baby is properly fed:  Newborns (babies 57 weeks of age or younger) may breastfeed every 1-3 hours.  Newborns should not go longer than 3 hours during the day or 5 hours during the night without breastfeeding.  You should breastfeed your baby a minimum of 8 times in a 24-hour period until you begin to introduce solid foods to your baby at around 39 months of age. Breast milk pumping Pumping and storing breast milk allows you to ensure that your baby is exclusively fed your breast milk, even at times when you are unable to breastfeed. This is especially important if you are going back to work while you are still breastfeeding or when you are not able to be present during feedings. Your lactation consultant can give you guidelines on how long it is safe to store breast milk. A breast pump is a machine that allows you to pump milk from your breast into a sterile bottle. The pumped breast milk can then be stored in a refrigerator or freezer. Some breast pumps are  operated by hand, while others use electricity. Ask your lactation consultant which type will work best for you. Breast pumps can be purchased, but some hospitals and breastfeeding support groups lease breast pumps on a monthly basis. A lactation consultant can teach you how to hand express breast milk, if you prefer not to use a pump. Caring for your breasts while you breastfeed Nipples can become dry, cracked, and sore while breastfeeding. The following recommendations can help keep your breasts moisturized and healthy:  Avoid using soap on your nipples.  Wear a supportive bra. Although not required, special nursing bras and tank tops are designed to allow access to your breasts for breastfeeding without taking off your entire bra or top. Avoid wearing underwire-style bras or extremely tight bras.  Air dry your nipples for 3-56minutes after each feeding.  Use only cotton bra pads to absorb leaked breast milk. Leaking of breast milk between feedings is normal.  Use lanolin on your nipples after breastfeeding. Lanolin helps to maintain your skin's normal moisture barrier. If you use pure lanolin, you do not need to  wash it off before feeding your baby again. Pure lanolin is not toxic to your baby. You may also hand express a few drops of breast milk and gently massage that milk into your nipples and allow the milk to air dry. In the first few weeks after giving birth, some women experience extremely full breasts (engorgement). Engorgement can make your breasts feel heavy, warm, and tender to the touch. Engorgement peaks within 3-5 days after you give birth. The following recommendations can help ease engorgement:  Completely empty your breasts while breastfeeding or pumping. You may want to start by applying warm, moist heat (in the shower or with warm water-soaked hand towels) just before feeding or pumping. This increases circulation and helps the milk flow. If your baby does not completely empty  your breasts while breastfeeding, pump any extra milk after he or she is finished.  Wear a snug bra (nursing or regular) or tank top for 1-2 days to signal your body to slightly decrease milk production.  Apply ice packs to your breasts, unless this is too uncomfortable for you.  Make sure that your baby is latched on and positioned properly while breastfeeding. If engorgement persists after 48 hours of following these recommendations, contact your health care provider or a Advertising copywriter. Overall health care recommendations while breastfeeding  Eat healthy foods. Alternate between meals and snacks, eating 3 of each per day. Because what you eat affects your breast milk, some of the foods may make your baby more irritable than usual. Avoid eating these foods if you are sure that they are negatively affecting your baby.  Drink milk, fruit juice, and water to satisfy your thirst (about 10 glasses a day).  Rest often, relax, and continue to take your prenatal vitamins to prevent fatigue, stress, and anemia.  Continue breast self-awareness checks.  Avoid chewing and smoking tobacco. Chemicals from cigarettes that pass into breast milk and exposure to secondhand smoke may harm your baby.  Avoid alcohol and drug use, including marijuana. Some medicines that may be harmful to your baby can pass through breast milk. It is important to ask your health care provider before taking any medicine, including all over-the-counter and prescription medicine as well as vitamin and herbal supplements. It is possible to become pregnant while breastfeeding. If birth control is desired, ask your health care provider about options that will be safe for your baby. Contact a health care provider if:  You feel like you want to stop breastfeeding or have become frustrated with breastfeeding.  You have painful breasts or nipples.  Your nipples are cracked or bleeding.  Your breasts are red, tender, or  warm.  You have a swollen area on either breast.  You have a fever or chills.  You have nausea or vomiting.  You have drainage other than breast milk from your nipples.  Your breasts do not become full before feedings by the fifth day after you give birth.  You feel sad and depressed.  Your baby is too sleepy to eat well.  Your baby is having trouble sleeping.  Your baby is wetting less than 3 diapers in a 24-hour period.  Your baby has less than 3 stools in a 24-hour period.  Your baby's skin or the white part of his or her eyes becomes yellow.  Your baby is not gaining weight by 38 days of age. Get help right away if:  Your baby is overly tired (lethargic) and does not want to wake up and feed.  Your baby develops an unexplained fever. This information is not intended to replace advice given to you by your health care provider. Make sure you discuss any questions you have with your health care provider. Document Released: 01/13/2005 Document Revised: 06/27/2015 Document Reviewed: 07/07/2012 Elsevier Interactive Patient Education  2017 [employer].

## 2016-06-10 LAB — CYTOLOGY - PAP: Diagnosis: NEGATIVE

## 2016-06-11 LAB — HEMOGLOBINOPATHY EVALUATION
Ferritin: 24 ng/mL (ref 15–150)
HGB A: 97.3 % (ref 96.4–98.8)
HGB SOLUBILITY: NEGATIVE
HGB VARIANT: 1.2 % — AB
Hgb A2 Quant: 1.5 % — ABNORMAL LOW (ref 1.8–3.2)
Hgb C: 0 %
Hgb F Quant: 0 % (ref 0.0–2.0)
Hgb S: 0 %

## 2016-06-11 LAB — PRENATAL PROFILE I(LABCORP)
ANTIBODY SCREEN: NEGATIVE
BASOS: 0 %
Basophils Absolute: 0 10*3/uL (ref 0.0–0.2)
EOS (ABSOLUTE): 0.1 10*3/uL (ref 0.0–0.4)
Eos: 1 %
HEMATOCRIT: 40.6 % (ref 34.0–46.6)
HEMOGLOBIN: 13.7 g/dL (ref 11.1–15.9)
Hepatitis B Surface Ag: NEGATIVE
IMMATURE GRANS (ABS): 0 10*3/uL (ref 0.0–0.1)
Immature Granulocytes: 0 %
Lymphocytes Absolute: 2.5 10*3/uL (ref 0.7–3.1)
Lymphs: 24 %
MCH: 29.8 pg (ref 26.6–33.0)
MCHC: 33.7 g/dL (ref 31.5–35.7)
MCV: 89 fL (ref 79–97)
MONOCYTES: 5 %
MONOS ABS: 0.5 10*3/uL (ref 0.1–0.9)
Neutrophils Absolute: 7 10*3/uL (ref 1.4–7.0)
Neutrophils: 70 %
PLATELETS: 212 10*3/uL (ref 150–379)
RBC: 4.59 x10E6/uL (ref 3.77–5.28)
RDW: 13.7 % (ref 12.3–15.4)
RPR: NONREACTIVE
RUBELLA: 4.41 {index} (ref >0.99)
Rh Factor: POSITIVE
WBC: 10.2 10*3/uL (ref 3.4–10.8)

## 2016-06-13 LAB — URINE CULTURE, OB REFLEX

## 2016-06-13 LAB — CULTURE, OB URINE

## 2016-06-14 ENCOUNTER — Encounter (HOSPITAL_COMMUNITY): Payer: Self-pay

## 2016-06-14 ENCOUNTER — Inpatient Hospital Stay (HOSPITAL_COMMUNITY)
Admission: AD | Admit: 2016-06-14 | Discharge: 2016-06-14 | Disposition: A | Discharge status: Home / Self Care | Source: Ambulatory Visit | Attending: Obstetrics and Gynecology | Admitting: Obstetrics and Gynecology

## 2016-06-14 DIAGNOSIS — M79652 Pain in left thigh: Secondary | ICD-10-CM

## 2016-06-14 DIAGNOSIS — R109 Unspecified abdominal pain: Secondary | ICD-10-CM | POA: Diagnosis present

## 2016-06-14 DIAGNOSIS — O26891 Other specified pregnancy related conditions, first trimester: Secondary | ICD-10-CM | POA: Insufficient documentation

## 2016-06-14 DIAGNOSIS — Z3A10 10 weeks gestation of pregnancy: Secondary | ICD-10-CM | POA: Insufficient documentation

## 2016-06-14 DIAGNOSIS — O9989 Other specified diseases and conditions complicating pregnancy, childbirth and the puerperium: Secondary | ICD-10-CM

## 2016-06-14 DIAGNOSIS — M25552 Pain in left hip: Secondary | ICD-10-CM | POA: Diagnosis present

## 2016-06-14 LAB — URINALYSIS, ROUTINE W REFLEX MICROSCOPIC
Bilirubin Urine: NEGATIVE
Glucose, UA: NEGATIVE mg/dL
Hgb urine dipstick: NEGATIVE
KETONES UR: NEGATIVE mg/dL
Leukocytes, UA: NEGATIVE
Nitrite: NEGATIVE
PH: 7 (ref 5.0–8.0)
Protein, ur: NEGATIVE mg/dL
Specific Gravity, Urine: 1.015 (ref 1.005–1.030)

## 2016-06-14 MED ORDER — CYCLOBENZAPRINE HCL 10 MG PO TABS
10.0000 mg | ORAL_TABLET | Freq: Two times a day (BID) | ORAL | 0 refills | Status: DC | PRN
Start: 2016-06-14 — End: 2016-10-03

## 2016-06-14 NOTE — MAU Provider Note (Signed)
History     CSN: [id number]  Arrival date and time: 06/14/16 0840   First Provider Initiated Contact with Patient 06/14/16 [phone]      Chief Complaint  Patient presents with  . Abdominal Pain   HPI   [name] is a 21 y.o. female G2P1001 @ 4231w4d here in MAU with left hip pain. The pain started yesterday. The pain is constant and depends on the position that she is in. The pain worsens when she bends her hip and sits. The pain does not shoot or radiate. The pain at times seems to be in her lower back. The pain feels like pressure in her thigh.  The pain did not keep her up at night. Denies history of clotting disorder or DVT. Denies redness or swelling to the area. She took 1000 mg of tylenol and was able to fall asleep.   She left work today due to the discomfort. She also had to call out of work on Wednesday because she was carrying boxes and felt like she had done too much. No injury that she knows of.     OB History    Gravida Para Term Preterm AB Living   2 1 1     1    SAB TAB Ectopic Multiple Live Births           1      History reviewed. No pertinent past medical history.  Past Surgical History:  Procedure Laterality Date  . ESOPHAGOGASTRODUODENOSCOPY    . WISDOM TOOTH EXTRACTION      Family History  Problem Relation Age of Onset  . Diabetes Father   . Diabetes Sister     Social History  Substance Use Topics  . Smoking status: Never Smoker  . Smokeless tobacco: Never Used  . Alcohol use No    Allergies: No Known Allergies  Prescriptions Prior to Admission  Medication Sig Dispense Refill Last Dose  . cetirizine (ZYRTEC) 10 MG tablet Take 10 mg by mouth daily as needed for allergies.   Taking  . omeprazole (PRILOSEC) 20 MG capsule Take 1 capsule (20 mg total) by mouth daily. (Patient not taking: Reported on 06/09/2016) 30 capsule 0 Not Taking   Results for orders placed or performed during the hospital encounter of 06/14/16 (from the past 48 hour(s))   Urinalysis, Routine w reflex microscopic     Status: Abnormal   Collection Time: 06/14/16  8:50 AM  Result Value Ref Range   Color, Urine YELLOW YELLOW   APPearance CLEAR CLEAR   Specific Gravity, Urine 1.015 1.005 - 1.030   pH 7.0 5.0 - 8.0   Glucose, UA NEGATIVE NEGATIVE mg/dL   Hgb urine dipstick NEGATIVE NEGATIVE   Bilirubin Urine NEGATIVE NEGATIVE   Ketones, ur NEGATIVE NEGATIVE mg/dL   Protein, ur NEGATIVE NEGATIVE mg/dL   Nitrite NEGATIVE NEGATIVE   Leukocytes, UA NEGATIVE NEGATIVE    Comment: Microscopic not done on urines with negative protein, blood, leukocytes, nitrite, or glucose < 500 mg/dL.   RBC / HPF 0-5 0 - 5 RBC/hpf   WBC, UA 0-5 0 - 5 WBC/hpf   Bacteria, UA RARE (A) NONE SEEN   Squamous Epithelial / LPF 0-5 (A) NONE SEEN   Mucous PRESENT     Review of Systems  Constitutional: Negative for fever.  Gastrointestinal: Negative for abdominal pain.  Genitourinary: Negative for dysuria, vaginal bleeding and vaginal discharge.  Musculoskeletal: Positive for back pain.   Physical Exam   Blood pressure (!) 130/58,  pulse 84, temperature 98.2 F (36.8 C), resp. rate 18, height 5\' 3"  (1.6 m), weight 163 lb 0.6 oz (74 kg), last menstrual period 04/01/2016.  Physical Exam  Constitutional: She is oriented to person, place, and time. She appears well-developed and well-nourished. No distress.  HENT:  Head: Normocephalic.  Eyes: Pupils are equal, round, and reactive to light.  GI: Soft. She exhibits no distension. There is no tenderness. There is no rebound.  Musculoskeletal: Normal range of motion. She exhibits no edema, tenderness or deformity.       Right upper leg: She exhibits no tenderness, no bony tenderness, no swelling, no edema, no deformity and no laceration.  Neurological: She is alert and oriented to person, place, and time.  Skin: Skin is warm. She is not diaphoretic.  Psychiatric: Her behavior is normal.   MAU Course  Procedures  None  MDM  UA +  fetal movement on bedside US Patient walked out of MAU without signs of distress or difficulty.   Assessment and Plan   A:  1. Thigh pain, musculoskeletal, left     P:  Discharge home in stable condition Rx: Flexeril Return to MAU for OB emergencies only If symptoms worsen go to Urgent care Ok to use tylenol as directed on the bottle.    Venia Carbon I, NP 06/14/2016 1:48 PM

## 2016-06-14 NOTE — MAU Note (Signed)
Patient presents with pain in left hip and thigh since last night, took Tylenol last night did not help.

## 2016-06-14 NOTE — MAU Note (Signed)
Patient states her supervisor told her she had to go to the doctor to be seen and told if she has any restrictions.

## 2016-06-14 NOTE — Discharge Instructions (Signed)
Heat Therapy Heat therapy can help ease sore, stiff, injured, and tight muscles and joints. Heat relaxes your muscles, which may help ease your pain. Heat therapy should only be used on old, pre-existing, or long-lasting (chronic) injuries. Do not use heat therapy unless told by your doctor. How to use heat therapy There are several different kinds of heat therapy, including:  Moist heat pack.  Warm water bath.  Hot water bottle.  Electric heating pad.  Heated gel pack.  Heated wrap.  Electric heating pad. General heat therapy recommendations  Do not sleep while using heat therapy. Only use heat therapy while you are awake.  Your skin may turn pink while using heat therapy. Do not use heat therapy if your skin turns red.  Do not use heat therapy if you have new pain.  High heat or long exposure to heat can cause burns. Be careful when using heat therapy to avoid burning your skin.  Do not use heat therapy on areas of your skin that are already irritated, such as with a rash or sunburn. Get help if:  You have blisters, redness, swelling (puffiness), or numbness.  You have new pain.  Your pain is worse. This information is not intended to replace advice given to you by your health care provider. Make sure you discuss any questions you have with your health care provider. Document Released: 04/07/2011 Document Revised: 06/21/2015 Document Reviewed: 03/08/2013 Elsevier Interactive Patient Education  2017 [employer]   Musculoskeletal Pain Musculoskeletal pain is muscle and bone aches and pains. This pain can occur in any part of the body. Follow these instructions at home:  Only take medicines for pain, discomfort, or fever as told by your health care provider.  You may continue all activities unless the activities cause more pain. When the pain lessens, slowly resume normal activities. Gradually increase the intensity and duration of the activities or  exercise.  During periods of severe pain, bed rest may be helpful. Lie or sit in any position that is comfortable, but get out of bed and walk around at least every several hours.  If directed, put ice on the injured area.  Put ice in a plastic bag.  Place a towel between your skin and the bag.  Leave the ice on for 20 minutes, 2-3 times a day. Contact a health care provider if:  Your pain is getting worse.  Your pain is not relieved with medicines.  You lose function in the area of the pain if the pain is in your arms, legs, or neck. This information is not intended to replace advice given to you by your health care provider. Make sure you discuss any questions you have with your health care provider. Document Released: 01/13/2005 Document Revised: 06/26/2015 Document Reviewed: 09/17/2012 Elsevier Interactive Patient Education  2017 [employer]

## 2016-06-16 ENCOUNTER — Encounter: Payer: Self-pay | Admitting: Family Medicine

## 2016-06-16 DIAGNOSIS — O2341 Unspecified infection of urinary tract in pregnancy, first trimester: Secondary | ICD-10-CM

## 2016-06-16 DIAGNOSIS — B951 Streptococcus, group B, as the cause of diseases classified elsewhere: Secondary | ICD-10-CM | POA: Insufficient documentation

## 2016-06-30 ENCOUNTER — Ambulatory Visit (INDEPENDENT_AMBULATORY_CARE_PROVIDER_SITE_OTHER): Admitting: Family Medicine

## 2016-06-30 ENCOUNTER — Encounter: Payer: Self-pay | Admitting: Family Medicine

## 2016-06-30 VITALS — BP 121/67 | HR 78 | Wt 158.5 lb

## 2016-06-30 DIAGNOSIS — O219 Vomiting of pregnancy, unspecified: Secondary | ICD-10-CM

## 2016-06-30 DIAGNOSIS — Z3480 Encounter for supervision of other normal pregnancy, unspecified trimester: Secondary | ICD-10-CM

## 2016-06-30 DIAGNOSIS — Z3481 Encounter for supervision of other normal pregnancy, first trimester: Secondary | ICD-10-CM

## 2016-06-30 MED ORDER — PROMETHAZINE HCL 25 MG PO TABS: 25.0000 mg | ORAL_TABLET | Freq: Four times a day (QID) | ORAL | 2 refills | Status: DC | PRN

## 2016-06-30 NOTE — Progress Notes (Signed)
   PRENATAL VISIT NOTE  Subjective:  Annette Steele is a 21 y.o. G2P1001 at [redacted]w[redacted]d being seen today for ongoing prenatal care.  She is currently monitored for the following issues for this low-risk pregnancy and has Supervision of other normal pregnancy, antepartum and GBS (group b Streptococcus) UTI complicating pregnancy, first trimester on her problem list.  Patient reports backache and nausea.  Contractions: Not present. Vag. Bleeding: None.   . Denies leaking of fluid.   The following portions of the patient's history were reviewed and updated as appropriate: allergies, current medications, past family history, past medical history, past social history, past surgical history and problem list. Problem list updated.  Objective:   Vitals:   06/30/16 0803  BP: 121/67  Pulse: 78  Weight: 158 lb 8 oz (71.9 kg)    Fetal Status: Fetal Heart Rate (bpm): 164         General:  Alert, oriented and cooperative. Patient is in no acute distress.  Skin: Skin is warm and dry. No rash noted.   Cardiovascular: Normal heart rate noted  Respiratory: Normal respiratory effort, no problems with respiration noted  Abdomen: Soft, gravid, appropriate for gestational age. Pain/Pressure: Absent     Pelvic:  Cervical exam deferred        Extremities: Normal range of motion.  Edema: None  Mental Status: Normal mood and affect. Normal behavior. Normal judgment and thought content.   Assessment and Plan:  Pregnancy: G2P1001 at [redacted]w[redacted]d  1. Supervision of other normal pregnancy, antepartum - HIV antibody (with reflex) Work letter given  2. Nausea and vomiting during pregnancy prior to [redacted] weeks gestation Trial of Promethazine--other OTC recommendations given - promethazine (PHENERGAN) 25 MG tablet; Take 1 tablet (25 mg total) by mouth every 6 (six) hours as needed for nausea.  Dispense: 42 tablet; Refill: 2  General obstetric precautions including but not limited to vaginal bleeding, contractions, leaking of  fluid and fetal movement were reviewed in detail with the patient. Please refer to After Visit Summary for other counseling recommendations.  [facility] about 8 weeks (around 08/25/2016[facility]   Reva Boresanya S Tamura Lasky, MD

## 2016-06-30 NOTE — Patient Instructions (Signed)
 Second Trimester of Pregnancy The second trimester is from week 14 through week 27 (months 4 through 6). The second trimester is often a time when you feel your best. Your body has adjusted to being pregnant, and you begin to feel better physically. Usually, morning sickness has lessened or quit completely, you may have more energy, and you may have an increase in appetite. The second trimester is also a time when the fetus is growing rapidly. At the end of the sixth month, the fetus is about 9 inches long and weighs about 1 pounds. You will likely begin to feel the baby move (quickening) between 16 and 20 weeks of pregnancy. Body changes during your second trimester Your body continues to go through many changes during your second trimester. The changes vary from woman to woman.  Your weight will continue to increase. You will notice your lower abdomen bulging out.  You may begin to get stretch marks on your hips, abdomen, and breasts.  You may develop headaches that can be relieved by medicines. The medicines should be approved by your health care provider.  You may urinate more often because the fetus is pressing on your bladder.  You may develop or continue to have heartburn as a result of your pregnancy.  You may develop constipation because certain hormones are causing the muscles that push waste through your intestines to slow down.  You may develop hemorrhoids or swollen, bulging veins (varicose veins).  You may have back pain. This is caused by: ? Weight gain. ? Pregnancy hormones that are relaxing the joints in your pelvis. ? A shift in weight and the muscles that support your balance.  Your breasts will continue to grow and they will continue to become tender.  Your gums may bleed and may be sensitive to brushing and flossing.  Dark spots or blotches (chloasma, mask of pregnancy) may develop on your face. This will likely fade after the baby is born.  A dark line from  your belly button to the pubic area (linea nigra) may appear. This will likely fade after the baby is born.  You may have changes in your hair. These can include thickening of your hair, rapid growth, and changes in texture. Some women also have hair loss during or after pregnancy, or hair that feels dry or thin. Your hair will most likely return to normal after your baby is born.  What to expect at prenatal visits During a routine prenatal visit:  You will be weighed to make sure you and the fetus are growing normally.  Your blood pressure will be taken.  Your abdomen will be measured to track your baby's growth.  The fetal heartbeat will be listened to.  Any test results from the previous visit will be discussed.  Your health care provider may ask you:  How you are feeling.  If you are feeling the baby move.  If you have had any abnormal symptoms, such as leaking fluid, bleeding, severe headaches, or abdominal cramping.  If you are using any tobacco products, including cigarettes, chewing tobacco, and electronic cigarettes.  If you have any questions.  Other tests that may be performed during your second trimester include:  Blood tests that check for: ? Low iron levels (anemia). ? High blood sugar that affects pregnant women (gestational diabetes) between 24 and 28 weeks. ? Rh antibodies. This is to check for a protein on red blood cells (Rh factor).  Urine tests to check for infections, diabetes,   or protein in the urine.  An ultrasound to confirm the proper growth and development of the baby.  An amniocentesis to check for possible genetic problems.  Fetal screens for spina bifida and Down syndrome.  HIV (human immunodeficiency virus) testing. Routine prenatal testing includes screening for HIV, unless you choose not to have this test.  Follow these instructions at home: Medicines  Follow your health care provider's instructions regarding medicine use. Specific  medicines may be either safe or unsafe to take during pregnancy.  Take a prenatal vitamin that contains at least 600 micrograms (mcg) of folic acid.  If you develop constipation, try taking a stool softener if your health care provider approves. Eating and drinking  Eat a balanced diet that includes fresh fruits and vegetables, whole grains, good sources of protein such as meat, eggs, or tofu, and low-fat dairy. Your health care provider will help you determine the amount of weight gain that is right for you.  Avoid raw meat and uncooked cheese. These carry germs that can cause birth defects in the baby.  If you have low calcium intake from food, talk to your health care provider about whether you should take a daily calcium supplement.  Limit foods that are high in fat and processed sugars, such as fried and sweet foods.  To prevent constipation: ? Drink enough fluid to keep your urine clear or pale yellow. ? Eat foods that are high in fiber, such as fresh fruits and vegetables, whole grains, and beans. Activity  Exercise only as directed by your health care provider. Most women can continue their usual exercise routine during pregnancy. Try to exercise for 30 minutes at least 5 days a week. Stop exercising if you experience uterine contractions.  Avoid heavy lifting, wear low heel shoes, and practice good posture.  A sexual relationship may be continued unless your health care provider directs you otherwise. Relieving pain and discomfort  Wear a good support bra to prevent discomfort from breast tenderness.  Take warm sitz baths to soothe any pain or discomfort caused by hemorrhoids. Use hemorrhoid cream if your health care provider approves.  Rest with your legs elevated if you have leg cramps or low back pain.  If you develop varicose veins, wear support hose. Elevate your feet for 15 minutes, 3-4 times a day. Limit salt in your diet. Prenatal Care  Write down your questions.  Take them to your prenatal visits.  Keep all your prenatal visits as told by your health care provider. This is important. Safety  Wear your seat belt at all times when driving.  Make a list of emergency phone numbers, including numbers for family, friends, the hospital, and police and fire departments. General instructions  Ask your health care provider for a referral to a local prenatal education class. Begin classes no later than the beginning of month 6 of your pregnancy.  Ask for help if you have counseling or nutritional needs during pregnancy. Your health care provider can offer advice or refer you to specialists for help with various needs.  Do not use hot tubs, steam rooms, or saunas.  Do not douche or use tampons or scented sanitary pads.  Do not cross your legs for long periods of time.  Avoid cat litter boxes and soil used by cats. These carry germs that can cause birth defects in the baby and possibly loss of the fetus by miscarriage or stillbirth.  Avoid all smoking, herbs, alcohol, and unprescribed drugs. Chemicals in these products   can affect the formation and growth of the baby.  Do not use any products that contain nicotine or tobacco, such as cigarettes and e-cigarettes. If you need help quitting, ask your health care provider.  Visit your dentist if you have not gone yet during your pregnancy. Use a soft toothbrush to brush your teeth and be gentle when you floss. Contact a health care provider if:  You have dizziness.  You have mild pelvic cramps, pelvic pressure, or nagging pain in the abdominal area.  You have persistent nausea, vomiting, or diarrhea.  You have a bad smelling vaginal discharge.  You have pain when you urinate. Get help right away if:  You have a fever.  You are leaking fluid from your vagina.  You have spotting or bleeding from your vagina.  You have severe abdominal cramping or pain.  You have rapid weight gain or weight  loss.  You have shortness of breath with chest pain.  You notice sudden or extreme swelling of your face, hands, ankles, feet, or legs.  You have not felt your baby move in over an hour.  You have severe headaches that do not go away when you take medicine.  You have vision changes. Summary  The second trimester is from week 14 through week 27 (months 4 through 6). It is also a time when the fetus is growing rapidly.  Your body goes through many changes during pregnancy. The changes vary from woman to woman.  Avoid all smoking, herbs, alcohol, and unprescribed drugs. These chemicals affect the formation and growth your baby.  Do not use any tobacco products, such as cigarettes, chewing tobacco, and e-cigarettes. If you need help quitting, ask your health care provider.  Contact your health care provider if you have any questions. Keep all prenatal visits as told by your health care provider. This is important. This information is not intended to replace advice given to you by your health care provider. Make sure you discuss any questions you have with your health care provider. Document Released: 01/07/2001 Document Revised: 06/21/2015 Document Reviewed: 03/16/2012 [employer] Interactive Patient Education  2017 [employer]   Breastfeeding Deciding to breastfeed is one of the best choices you can make for you and your baby. A change in hormones during pregnancy causes your breast tissue to grow and increases the number and size of your milk ducts. These hormones also allow proteins, sugars, and fats from your blood supply to make breast milk in your milk-producing glands. Hormones prevent breast milk from being released before your baby is born as well as prompt milk flow after birth. Once breastfeeding has begun, thoughts of your baby, as well as his or her sucking or crying, can stimulate the release of milk from your milk-producing glands. Benefits of breastfeeding For Your  Baby  Your first milk (colostrum) helps your baby's digestive system function better.  There are antibodies in your milk that help your baby fight off infections.  Your baby has a lower incidence of asthma, allergies, and sudden infant death syndrome.  The nutrients in breast milk are better for your baby than infant formulas and are designed uniquely for your baby's needs.  Breast milk improves your baby's brain development.  Your baby is less likely to develop other conditions, such as childhood obesity, asthma, or type 2 diabetes mellitus.  For You  Breastfeeding helps to create a very special bond between you and your baby.  Breastfeeding is convenient. Breast milk is always available at   the correct temperature and costs nothing.  Breastfeeding helps to burn calories and helps you lose the weight gained during pregnancy.  Breastfeeding makes your uterus contract to its prepregnancy size faster and slows bleeding (lochia) after you give birth.  Breastfeeding helps to lower your risk of developing type 2 diabetes mellitus, osteoporosis, and breast or ovarian cancer later in life.  Signs that your baby is hungry Early Signs of Hunger  Increased alertness or activity.  Stretching.  Movement of the head from side to side.  Movement of the head and opening of the mouth when the corner of the mouth or cheek is stroked (rooting).  Increased sucking sounds, smacking lips, cooing, sighing, or squeaking.  Hand-to-mouth movements.  Increased sucking of fingers or hands.  Late Signs of Hunger  Fussing.  Intermittent crying.  Extreme Signs of Hunger Signs of extreme hunger will require calming and consoling before your baby will be able to breastfeed successfully. Do not wait for the following signs of extreme hunger to occur before you initiate breastfeeding:  Restlessness.  A loud, strong cry.  Screaming.  Breastfeeding basics Breastfeeding Initiation  Find a  comfortable place to sit or lie down, with your neck and back well supported.  Place a pillow or rolled up blanket under your baby to bring him or her to the level of your breast (if you are seated). Nursing pillows are specially designed to help support your arms and your baby while you breastfeed.  Make sure that your baby's abdomen is facing your abdomen.  Gently massage your breast. With your fingertips, massage from your chest wall toward your nipple in a circular motion. This encourages milk flow. You may need to continue this action during the feeding if your milk flows slowly.  Support your breast with 4 fingers underneath and your thumb above your nipple. Make sure your fingers are well away from your nipple and your baby's mouth.  Stroke your baby's lips gently with your finger or nipple.  When your baby's mouth is open wide enough, quickly bring your baby to your breast, placing your entire nipple and as much of the colored area around your nipple (areola) as possible into your baby's mouth. ? More areola should be visible above your baby's upper lip than below the lower lip. ? Your baby's tongue should be between his or her lower gum and your breast.  Ensure that your baby's mouth is correctly positioned around your nipple (latched). Your baby's lips should create a seal on your breast and be turned out (everted).  It is common for your baby to suck about 2-3 minutes in order to start the flow of breast milk.  Latching Teaching your baby how to latch on to your breast properly is very important. An improper latch can cause nipple pain and decreased milk supply for you and poor weight gain in your baby. Also, if your baby is not latched onto your nipple properly, he or she may swallow some air during feeding. This can make your baby fussy. Burping your baby when you switch breasts during the feeding can help to get rid of the air. However, teaching your baby to latch on properly is  still the best way to prevent fussiness from swallowing air while breastfeeding. Signs that your baby has successfully latched on to your nipple:  Silent tugging or silent sucking, without causing you pain.  Swallowing heard between every 3-4 sucks.  Muscle movement above and in front of his or her   ears while sucking.  Signs that your baby has not successfully latched on to nipple:  Sucking sounds or smacking sounds from your baby while breastfeeding.  Nipple pain.  If you think your baby has not latched on correctly, slip your finger into the corner of your baby's mouth to break the suction and place it between your baby's gums. Attempt breastfeeding initiation again. Signs of Successful Breastfeeding Signs from your baby:  A gradual decrease in the number of sucks or complete cessation of sucking.  Falling asleep.  Relaxation of his or her body.  Retention of a small amount of milk in his or her mouth.  Letting go of your breast by himself or herself.  Signs from you:  Breasts that have increased in firmness, weight, and size 1-3 hours after feeding.  Breasts that are softer immediately after breastfeeding.  Increased milk volume, as well as a change in milk consistency and color by the fifth day of breastfeeding.  Nipples that are not sore, cracked, or bleeding.  Signs That Your Baby is Getting Enough Milk  Wetting at least 1-2 diapers during the first 24 hours after birth.  Wetting at least 5-6 diapers every 24 hours for the first week after birth. The urine should be clear or pale yellow by 5 days after birth.  Wetting 6-8 diapers every 24 hours as your baby continues to grow and develop.  At least 3 stools in a 24-hour period by age 5 days. The stool should be soft and yellow.  At least 3 stools in a 24-hour period by age 7 days. The stool should be seedy and yellow.  No loss of weight greater than 10% of birth weight during the first 3 days of age.  Average  weight gain of 4-7 ounces (113-198 g) per week after age 4 days.  Consistent daily weight gain by age 5 days, without weight loss after the age of 2 weeks.  After a feeding, your baby may spit up a small amount. This is common. Breastfeeding frequency and duration Frequent feeding will help you make more milk and can prevent sore nipples and breast engorgement. Breastfeed when you feel the need to reduce the fullness of your breasts or when your baby shows signs of hunger. This is called "breastfeeding on demand." Avoid introducing a pacifier to your baby while you are working to establish breastfeeding (the first 4-6 weeks after your baby is born). After this time you may choose to use a pacifier. Research has shown that pacifier use during the first year of a baby's life decreases the risk of sudden infant death syndrome (SIDS). Allow your baby to feed on each breast as long as he or she wants. Breastfeed until your baby is finished feeding. When your baby unlatches or falls asleep while feeding from the first breast, offer the second breast. Because newborns are often sleepy in the first few weeks of life, you may need to awaken your baby to get him or her to feed. Breastfeeding times will vary from baby to baby. However, the following rules can serve as a guide to help you ensure that your baby is properly fed:  Newborns (babies 4 weeks of age or younger) may breastfeed every 1-3 hours.  Newborns should not go longer than 3 hours during the day or 5 hours during the night without breastfeeding.  You should breastfeed your baby a minimum of 8 times in a 24-hour period until you begin to introduce solid foods to your   baby at around 6 months of age.  Breast milk pumping Pumping and storing breast milk allows you to ensure that your baby is exclusively fed your breast milk, even at times when you are unable to breastfeed. This is especially important if you are going back to work while you are still  breastfeeding or when you are not able to be present during feedings. Your lactation consultant can give you guidelines on how long it is safe to store breast milk. A breast pump is a machine that allows you to pump milk from your breast into a sterile bottle. The pumped breast milk can then be stored in a refrigerator or freezer. Some breast pumps are operated by hand, while others use electricity. Ask your lactation consultant which type will work best for you. Breast pumps can be purchased, but some hospitals and breastfeeding support groups lease breast pumps on a monthly basis. A lactation consultant can teach you how to hand express breast milk, if you prefer not to use a pump. Caring for your breasts while you breastfeed Nipples can become dry, cracked, and sore while breastfeeding. The following recommendations can help keep your breasts moisturized and healthy:  Avoid using soap on your nipples.  Wear a supportive bra. Although not required, special nursing bras and tank tops are designed to allow access to your breasts for breastfeeding without taking off your entire bra or top. Avoid wearing underwire-style bras or extremely tight bras.  Air dry your nipples for 3-4minutes after each feeding.  Use only cotton bra pads to absorb leaked breast milk. Leaking of breast milk between feedings is normal.  Use lanolin on your nipples after breastfeeding. Lanolin helps to maintain your skin's normal moisture barrier. If you use pure lanolin, you do not need to wash it off before feeding your baby again. Pure lanolin is not toxic to your baby. You may also hand express a few drops of breast milk and gently massage that milk into your nipples and allow the milk to air dry.  In the first few weeks after giving birth, some women experience extremely full breasts (engorgement). Engorgement can make your breasts feel heavy, warm, and tender to the touch. Engorgement peaks within 3-5 days after you give  birth. The following recommendations can help ease engorgement:  Completely empty your breasts while breastfeeding or pumping. You may want to start by applying warm, moist heat (in the shower or with warm water-soaked hand towels) just before feeding or pumping. This increases circulation and helps the milk flow. If your baby does not completely empty your breasts while breastfeeding, pump any extra milk after he or she is finished.  Wear a snug bra (nursing or regular) or tank top for 1-2 days to signal your body to slightly decrease milk production.  Apply ice packs to your breasts, unless this is too uncomfortable for you.  Make sure that your baby is latched on and positioned properly while breastfeeding.  If engorgement persists after 48 hours of following these recommendations, contact your health care provider or a lactation consultant. Overall health care recommendations while breastfeeding  Eat healthy foods. Alternate between meals and snacks, eating 3 of each per day. Because what you eat affects your breast milk, some of the foods may make your baby more irritable than usual. Avoid eating these foods if you are sure that they are negatively affecting your baby.  Drink milk, fruit juice, and water to satisfy your thirst (about 10 glasses a day).    Rest often, relax, and continue to take your prenatal vitamins to prevent fatigue, stress, and anemia.  Continue breast self-awareness checks.  Avoid chewing and smoking tobacco. Chemicals from cigarettes that pass into breast milk and exposure to secondhand smoke may harm your baby.  Avoid alcohol and drug use, including marijuana. Some medicines that may be harmful to your baby can pass through breast milk. It is important to ask your health care provider before taking any medicine, including all over-the-counter and prescription medicine as well as vitamin and herbal supplements. It is possible to become pregnant while breastfeeding.  If birth control is desired, ask your health care provider about options that will be safe for your baby. Contact a health care provider if:  You feel like you want to stop breastfeeding or have become frustrated with breastfeeding.  You have painful breasts or nipples.  Your nipples are cracked or bleeding.  Your breasts are red, tender, or warm.  You have a swollen area on either breast.  You have a fever or chills.  You have nausea or vomiting.  You have drainage other than breast milk from your nipples.  Your breasts do not become full before feedings by the fifth day after you give birth.  You feel sad and depressed.  Your baby is too sleepy to eat well.  Your baby is having trouble sleeping.  Your baby is wetting less than 3 diapers in a 24-hour period.  Your baby has less than 3 stools in a 24-hour period.  Your baby's skin or the white part of his or her eyes becomes yellow.  Your baby is not gaining weight by 5 days of age. Get help right away if:  Your baby is overly tired (lethargic) and does not want to wake up and feed.  Your baby develops an unexplained fever. This information is not intended to replace advice given to you by your health care provider. Make sure you discuss any questions you have with your health care provider. Document Released: 01/13/2005 Document Revised: 06/27/2015 Document Reviewed: 07/07/2012 [employer] Interactive Patient Education  2017 [employer]  

## 2016-07-01 LAB — HIV ANTIBODY (ROUTINE TESTING W REFLEX): HIV SCREEN 4TH GENERATION: NONREACTIVE

## 2016-07-03 ENCOUNTER — Other Ambulatory Visit (HOSPITAL_COMMUNITY): Payer: Self-pay

## 2016-07-04 ENCOUNTER — Ambulatory Visit (HOSPITAL_COMMUNITY)
Admission: RE | Admit: 2016-07-04 | Discharge: 2016-07-04 | Disposition: A | Discharge status: Home / Self Care | Source: Ambulatory Visit | Attending: Family Medicine | Admitting: Family Medicine

## 2016-07-04 ENCOUNTER — Encounter (HOSPITAL_COMMUNITY): Payer: Self-pay

## 2016-07-04 ENCOUNTER — Other Ambulatory Visit: Payer: Self-pay | Admitting: Family Medicine

## 2016-07-04 DIAGNOSIS — B951 Streptococcus, group B, as the cause of diseases classified elsewhere: Secondary | ICD-10-CM

## 2016-07-04 DIAGNOSIS — Z3682 Encounter for antenatal screening for nuchal translucency: Secondary | ICD-10-CM | POA: Diagnosis not present

## 2016-07-04 DIAGNOSIS — Z3A13 13 weeks gestation of pregnancy: Secondary | ICD-10-CM | POA: Insufficient documentation

## 2016-07-04 DIAGNOSIS — O2341 Unspecified infection of urinary tract in pregnancy, first trimester: Secondary | ICD-10-CM

## 2016-07-04 DIAGNOSIS — Z3480 Encounter for supervision of other normal pregnancy, unspecified trimester: Secondary | ICD-10-CM

## 2016-07-04 HISTORY — DX: Cardiac murmur, unspecified: R01.1

## 2016-07-09 ENCOUNTER — Other Ambulatory Visit: Payer: Self-pay

## 2016-07-20 ENCOUNTER — Inpatient Hospital Stay (HOSPITAL_COMMUNITY)
Admission: AD | Admit: 2016-07-20 | Discharge: 2016-07-20 | Disposition: A | Discharge status: Home / Self Care | Source: Ambulatory Visit | Attending: Obstetrics & Gynecology | Admitting: Obstetrics & Gynecology

## 2016-07-20 ENCOUNTER — Encounter (HOSPITAL_COMMUNITY): Payer: Self-pay

## 2016-07-20 DIAGNOSIS — O219 Vomiting of pregnancy, unspecified: Secondary | ICD-10-CM | POA: Diagnosis not present

## 2016-07-20 DIAGNOSIS — R51 Headache: Secondary | ICD-10-CM | POA: Insufficient documentation

## 2016-07-20 DIAGNOSIS — Z3A15 15 weeks gestation of pregnancy: Secondary | ICD-10-CM | POA: Diagnosis not present

## 2016-07-20 DIAGNOSIS — M549 Dorsalgia, unspecified: Secondary | ICD-10-CM | POA: Insufficient documentation

## 2016-07-20 DIAGNOSIS — Z79899 Other long term (current) drug therapy: Secondary | ICD-10-CM | POA: Insufficient documentation

## 2016-07-20 DIAGNOSIS — O26892 Other specified pregnancy related conditions, second trimester: Secondary | ICD-10-CM | POA: Insufficient documentation

## 2016-07-20 DIAGNOSIS — R42 Dizziness and giddiness: Secondary | ICD-10-CM | POA: Diagnosis present

## 2016-07-20 DIAGNOSIS — R1030 Lower abdominal pain, unspecified: Secondary | ICD-10-CM | POA: Diagnosis not present

## 2016-07-20 LAB — URINALYSIS, ROUTINE W REFLEX MICROSCOPIC
Bilirubin Urine: NEGATIVE
Glucose, UA: NEGATIVE mg/dL
Hgb urine dipstick: NEGATIVE
Ketones, ur: NEGATIVE mg/dL
Nitrite: NEGATIVE
Protein, ur: NEGATIVE mg/dL
Specific Gravity, Urine: 1.011 (ref 1.005–1.030)
pH: 7 (ref 5.0–8.0)

## 2016-07-20 MED ORDER — PROMETHAZINE HCL 25 MG PO TABS: 12.5000 mg | ORAL_TABLET | Freq: Four times a day (QID) | ORAL | 0 refills | Status: DC | PRN

## 2016-07-20 NOTE — Discharge Instructions (Signed)
Second Trimester of Pregnancy The second trimester is from week 13 through week 28, month 4 through 6. This is often the time in pregnancy that you feel your best. Often times, morning sickness has lessened or quit. You may have more energy, and you may get hungry more often. Your unborn baby (fetus) is growing rapidly. At the end of the sixth month, he or she is about 9 inches long and weighs about 1 pounds. You will likely feel the baby move (quickening) between 18 and 20 weeks of pregnancy. Follow these instructions at home:  Avoid all smoking, herbs, and alcohol. Avoid drugs not approved by your doctor.  Do not use any tobacco products, including cigarettes, chewing tobacco, and electronic cigarettes. If you need help quitting, ask your doctor. You may get counseling or other support to help you quit.  Only take medicine as told by your doctor. Some medicines are safe and some are not during pregnancy.  Exercise only as told by your doctor. Stop exercising if you start having cramps.  Eat regular, healthy meals.  Wear a good support bra if your breasts are tender.  Do not use hot tubs, steam rooms, or saunas.  Wear your seat belt when driving.  Avoid raw meat, uncooked cheese, and liter boxes and soil used by cats.  Take your prenatal vitamins.  Take 1500-2000 milligrams of calcium daily starting at the 20th week of pregnancy until you deliver your baby.  Try taking medicine that helps you poop (stool softener) as needed, and if your doctor approves. Eat more fiber by eating fresh fruit, vegetables, and whole grains. Drink enough fluids to keep your pee (urine) clear or pale yellow.  Take warm water baths (sitz baths) to soothe pain or discomfort caused by hemorrhoids. Use hemorrhoid cream if your doctor approves.  If you have puffy, bulging veins (varicose veins), wear support hose. Raise (elevate) your feet for 15 minutes, 3-4 times a day. Limit salt in your diet.  Avoid heavy  lifting, wear low heals, and sit up straight.  Rest with your legs raised if you have leg cramps or low back pain.  Visit your dentist if you have not gone during your pregnancy. Use a soft toothbrush to brush your teeth. Be gentle when you floss.  You can have sex (intercourse) unless your doctor tells you not to.  Go to your doctor visits. Get help if:  You feel dizzy.  You have mild cramps or pressure in your lower belly (abdomen).  You have a nagging pain in your belly area.  You continue to feel sick to your stomach (nauseous), throw up (vomit), or have watery poop (diarrhea).  You have bad smelling fluid coming from your vagina.  You have pain with peeing (urination). Get help right away if:  You have a fever.  You are leaking fluid from your vagina.  You have spotting or bleeding from your vagina.  You have severe belly cramping or pain.  You lose or gain weight rapidly.  You have trouble catching your breath and have chest pain.  You notice sudden or extreme puffiness (swelling) of your face, hands, ankles, feet, or legs.  You have not felt the baby move in over an hour.  You have severe headaches that do not go away with medicine.  You have vision changes. This information is not intended to replace advice given to you by your health care provider. Make sure you discuss any questions you have with your health care   provider. Document Released: 04/09/2009 Document Revised: 06/21/2015 Document Reviewed: 03/16/2012 Elsevier Interactive Patient Education  2017 [employer] Inc[employer]  

## 2016-07-20 NOTE — MAU Provider Note (Signed)
History     CSN: [id number]  Arrival date and time: 07/20/16 2054   First Provider Initiated Contact with Patient 07/20/16 2227      Chief Complaint  Patient presents with  . Dizziness   Annette Steele is a 21 y.o. G2P1001 at [redacted]w[redacted]d who presents today with dizziness, headaches (off and on), back pain and lower abdominal pain. All of these have been going on off and on for a couple of weeks. She denies any VB or vaginal discharge. She denies any complications with this pregnancy or her last pregnancy.    Dizziness  This is a new problem. The current episode started in the past 7 days. The problem occurs intermittently. The problem has been unchanged. Associated symptoms include abdominal pain and headaches. Pertinent negatives include no chills, fever, nausea or vomiting. Nothing aggravates the symptoms. She has tried nothing for the symptoms.     Past Medical History:  Diagnosis Date  . Heart murmur     Past Surgical History:  Procedure Laterality Date  . ESOPHAGOGASTRODUODENOSCOPY    . WISDOM TOOTH EXTRACTION      Family History  Problem Relation Age of Onset  . Diabetes Father   . Diabetes Sister     Social History  Substance Use Topics  . Smoking status: Never Smoker  . Smokeless tobacco: Never Used  . Alcohol use No    Allergies: No Known Allergies  Prescriptions Prior to Admission  Medication Sig Dispense Refill Last Dose  . acetaminophen (TYLENOL) 500 MG tablet Take 1,000 mg by mouth every 6 (six) hours as needed for mild pain, moderate pain, fever or headache.   07/20/2016 at Unknown time  . Prenatal MV-Min-FA-Omega-3 (PRENATAL GUMMIES/DHA & FA) 0.4-32.5 MG CHEW Chew 2 each by mouth at bedtime.    07/19/2016 at Unknown time  . cetirizine (ZYRTEC) 10 MG tablet Take 10 mg by mouth daily as needed for allergies.   Not Taking  . cyclobenzaprine (FLEXERIL) 10 MG tablet Take 1 tablet (10 mg total) by mouth 2 (two) times daily as needed for muscle spasms. 10 tablet 0  Taking  . promethazine (PHENERGAN) 25 MG tablet Take 1 tablet (25 mg total) by mouth every 6 (six) hours as needed for nausea. 42 tablet 2 Taking    Review of Systems  Constitutional: Negative for chills and fever.  Gastrointestinal: Positive for abdominal pain. Negative for nausea and vomiting.  Genitourinary: Negative for dysuria, frequency, urgency, vaginal bleeding and vaginal discharge.  Neurological: Positive for dizziness and headaches.   Physical Exam   Blood pressure 125/71, pulse 90, temperature 98.3 F (36.8 C), temperature source Oral, resp. rate 16, height 5\' 3"  (1.6 m), weight 163 lb (73.9 kg), last menstrual period 04/01/2016, SpO2 100 %.  Physical Exam  Nursing note and vitals reviewed. Constitutional: She is oriented to person, place, and time. She appears well-developed and well-nourished. No distress.  HENT:  Head: Normocephalic.  Cardiovascular: Normal rate.   Respiratory: Effort normal.  GI: Soft. There is no tenderness. There is no rebound.  Genitourinary:  Genitourinary Comments: FHT 155 with doppler   Neurological: She is alert and oriented to person, place, and time.  Skin: Skin is warm and dry.  Psychiatric: She has a normal mood and affect.   Results for orders placed or performed during the hospital encounter of 07/20/16 (from the past 24 hour(s))  Urinalysis, Routine w reflex microscopic     Status: Abnormal   Collection Time: 07/20/16  9:34 PM  Result  Value Ref Range   Color, Urine YELLOW YELLOW   APPearance HAZY (A) CLEAR   Specific Gravity, Urine 1.011 1.005 - 1.030   pH 7.0 5.0 - 8.0   Glucose, UA NEGATIVE NEGATIVE mg/dL   Hgb urine dipstick NEGATIVE NEGATIVE   Bilirubin Urine NEGATIVE NEGATIVE   Ketones, ur NEGATIVE NEGATIVE mg/dL   Protein, ur NEGATIVE NEGATIVE mg/dL   Nitrite NEGATIVE NEGATIVE   Leukocytes, UA LARGE (A) NEGATIVE   RBC / HPF 0-5 0 - 5 RBC/hpf   WBC, UA 6-30 0 - 5 WBC/hpf   Bacteria, UA MANY (A) NONE SEEN   Squamous  Epithelial / LPF 0-5 (A) NONE SEEN   Mucous PRESENT     MAU Course  Procedures  MDM D/W patient that dizziness and headaches can increase during pregnancy. Be sure to drink plenty of water, and rest when needed. FU with OB as planned.    Assessment and Plan   1. Nausea/vomiting in pregnancy   2. Dizziness   3. [redacted] weeks gestation of pregnancy    DC home Comfort measures reviewed  2nd Trimester precautions  Fetal kick counts RX: phenergan PRN #[id number]  Return to MAU as needed FU with OB as planned  Follow-up Information    [facility].   Specialty:  Obstetrics and Gynecology Contact information: [address] [place] [id number] [phone]           Thressa ShellerHeather Avalina Benko 07/20/2016, 10:28 PM

## 2016-07-20 NOTE — MAU Note (Addendum)
Pt states that she has had a lot of dizziness and left sided abdominal pain that started today. States she has also had some nausea x2 days ago-states she vomited twice today. States she has felt constipated-last BM was today.

## 2016-07-23 LAB — CULTURE, OB URINE

## 2016-08-19 ENCOUNTER — Inpatient Hospital Stay (HOSPITAL_COMMUNITY)
Admission: AD | Admit: 2016-08-19 | Discharge: 2016-08-19 | Disposition: A | Discharge status: Home / Self Care | Source: Ambulatory Visit | Attending: Family Medicine | Admitting: Family Medicine

## 2016-08-19 ENCOUNTER — Encounter (HOSPITAL_COMMUNITY): Payer: Self-pay | Admitting: *Deleted

## 2016-08-19 DIAGNOSIS — R05 Cough: Secondary | ICD-10-CM | POA: Diagnosis present

## 2016-08-19 DIAGNOSIS — Z3A20 20 weeks gestation of pregnancy: Secondary | ICD-10-CM | POA: Insufficient documentation

## 2016-08-19 DIAGNOSIS — O26892 Other specified pregnancy related conditions, second trimester: Secondary | ICD-10-CM | POA: Diagnosis not present

## 2016-08-19 DIAGNOSIS — R058 Other specified cough: Secondary | ICD-10-CM

## 2016-08-19 DIAGNOSIS — O9989 Other specified diseases and conditions complicating pregnancy, childbirth and the puerperium: Secondary | ICD-10-CM

## 2016-08-19 MED ORDER — BENZONATATE 100 MG PO CAPS: 100.0000 mg | ORAL_CAPSULE | Freq: Three times a day (TID) | ORAL | 0 refills | Status: DC

## 2016-08-19 NOTE — MAU Provider Note (Signed)
History     CSN: [id number]  Arrival date and time: 08/19/16 [id number]   None     Chief Complaint  Patient presents with  . Cough   HPI   [name] is a 21 y.o. female G2P1001 @ 3452w0d here in MAU with cough. The cough has been present since June. States she is coughing every day. Denies history of asthma, non smoker.  States she has not seen her PCP for this complaint. States she has not tried anything over the counter for this because she was uncertain to what she could take. The mucus is clear and at times yellow. States she has not traveled outside of the country recently. States she lives in a "newer" home. No exposure that she is aware of. Small child that lives with her has similar symptoms.   OB History    Gravida Para Term Preterm AB Living   2 1 1     1    SAB TAB Ectopic Multiple Live Births           1      Past Medical History:  Diagnosis Date  . Heart murmur     Past Surgical History:  Procedure Laterality Date  . ESOPHAGOGASTRODUODENOSCOPY    . WISDOM TOOTH EXTRACTION      Family History  Problem Relation Age of Onset  . Diabetes Father   . Diabetes Sister     Social History  Substance Use Topics  . Smoking status: Never Smoker  . Smokeless tobacco: Never Used  . Alcohol use No    Allergies: No Known Allergies  Prescriptions Prior to Admission  Medication Sig Dispense Refill Last Dose  . acetaminophen (TYLENOL) 500 MG tablet Take 1,000 mg by mouth every 6 (six) hours as needed for mild pain, moderate pain, fever or headache.   07/20/2016 at Unknown time  . cetirizine (ZYRTEC) 10 MG tablet Take 10 mg by mouth daily as needed for allergies.   Not Taking  . cyclobenzaprine (FLEXERIL) 10 MG tablet Take 1 tablet (10 mg total) by mouth 2 (two) times daily as needed for muscle spasms. 10 tablet 0 Taking  . Prenatal MV-Min-FA-Omega-3 (PRENATAL GUMMIES/DHA & FA) 0.4-32.5 MG CHEW Chew 2 each by mouth at bedtime.    07/19/2016 at Unknown time  .  promethazine (PHENERGAN) 25 MG tablet Take 0.5-1 tablets (12.5-25 mg total) by mouth every 6 (six) hours as needed. 30 tablet 0    No results found for this or any previous visit (from the past 48 hour(s)).  Review of Systems  Constitutional: Negative for fever.  HENT: Positive for congestion.   Respiratory: Positive for cough.    Physical Exam   Blood pressure 114/60, pulse 93, temperature 98.1 F (36.7 C), resp. rate 18, height 5\' 3"  (1.6 m), weight 162 lb (73.5 kg), last menstrual period 04/01/2016.  Physical Exam  Constitutional: She is oriented to person, place, and time. Vital signs are normal. She appears well-developed and well-nourished.  Non-toxic appearance. She does not have a sickly appearance. She does not appear ill. No distress.  HENT:  Head: Normocephalic.  Eyes: Pupils are equal, round, and reactive to light.  Cardiovascular: Normal rate.   Respiratory: Effort normal and breath sounds normal. No respiratory distress. She has no wheezes. She has no rales. She exhibits no tenderness.  Musculoskeletal: Normal range of motion.  Neurological: She is alert and oriented to person, place, and time.  Skin: Skin is warm. She is not diaphoretic.  Psychiatric:  Her behavior is normal.    MAU Course  Procedures  none  MDM  + fetal heart tones via   Assessment and Plan   A:  1. Cough productive of clear sputum     P:  Discharge home in stable condition Rx: Tessalon pearls Ok to use mucinex over the counter as indicated on the bottle  Cool mist humidifier recommended Increase PO fluid to 6-8 glasses of water a day  If symptoms have not improved in the next 7-10 days patient is to contact PCP for further workup.   Duane Lope, NP 08/19/2016 2:31 PM

## 2016-08-19 NOTE — MAU Note (Signed)
Pt presents to MAU with complaints of having a cough since June. Denies any problems with pregnancy. Denies pain

## 2016-08-19 NOTE — Discharge Instructions (Signed)
Cool Mist Vaporizer A cool mist vaporizer is a device that releases a cool mist into the air. If you have a cough or a cold, using a vaporizer may help relieve your symptoms. The mist adds moisture to the air, which may help thin your mucus and make it less sticky. When your mucus is thin and less sticky, it easier for you to breathe and to cough up secretions. Do not use a vaporizer if you are allergic to mold. Follow these instructions at home:  Follow the instructions that come with the vaporizer.  Do not use anything other than distilled water in the vaporizer.  Do not run the vaporizer all of the time. Doing that can cause mold or bacteria to grow in the vaporizer.  Clean the vaporizer after each time that you use it.  Clean and dry the vaporizer well before storing it.  Stop using the vaporizer if your breathing symptoms get worse. This information is not intended to replace advice given to you by your health care provider. Make sure you discuss any questions you have with your health care provider. Document Released: 10/11/2003 Document Revised: 08/03/2015 Document Reviewed: 04/14/2015 Elsevier Interactive Patient Education  2018 [employer] Inc[employer]   Cough, Adult Coughing is a reflex that clears your throat and your airways. Coughing helps to heal and protect your lungs. It is normal to cough occasionally, but a cough that happens with other symptoms or lasts a long time may be a sign of a condition that needs treatment. A cough may last only 2-3 weeks (acute), or it may last longer than 8 weeks (chronic). What are the causes? Coughing is commonly caused by:  Breathing in substances that irritate your lungs.  A viral or bacterial respiratory infection.  Allergies.  Asthma.  Postnasal drip.  Smoking.  Acid backing up from the stomach into the esophagus (gastroesophageal reflux).  Certain medicines.  Chronic lung problems, including COPD (or rarely, lung cancer).  Other  medical conditions such as heart failure.  Follow these instructions at home: Pay attention to any changes in your symptoms. Take these actions to help with your discomfort:  Take medicines only as told by your health care provider. ? If you were prescribed an antibiotic medicine, take it as told by your health care provider. Do not stop taking the antibiotic even if you start to feel better. ? Talk with your health care provider before you take a cough suppressant medicine.  Drink enough fluid to keep your urine clear or pale yellow.  If the air is dry, use a cold steam vaporizer or humidifier in your bedroom or your home to help loosen secretions.  Avoid anything that causes you to cough at work or at home.  If your cough is worse at night, try sleeping in a semi-upright position.  Avoid cigarette smoke. If you smoke, quit smoking. If you need help quitting, ask your health care provider.  Avoid caffeine.  Avoid alcohol.  Rest as needed.  Contact a health care provider if:  You have new symptoms.  You cough up pus.  Your cough does not get better after 2-3 weeks, or your cough gets worse.  You cannot control your cough with suppressant medicines and you are losing sleep.  You develop pain that is getting worse or pain that is not controlled with pain medicines.  You have a fever.  You have unexplained weight loss.  You have night sweats. Get help right away if:  You cough up  blood.  You have difficulty breathing.  Your heartbeat is very fast. This information is not intended to replace advice given to you by your health care provider. Make sure you discuss any questions you have with your health care provider. Document Released: 07/12/2010 Document Revised: 06/21/2015 Document Reviewed: 03/22/2014 Elsevier Interactive Patient Education  2017 [employer]

## 2016-08-25 ENCOUNTER — Ambulatory Visit (INDEPENDENT_AMBULATORY_CARE_PROVIDER_SITE_OTHER): Admitting: Obstetrics and Gynecology

## 2016-08-25 ENCOUNTER — Encounter: Payer: Self-pay | Admitting: Obstetrics and Gynecology

## 2016-08-25 DIAGNOSIS — Z3480 Encounter for supervision of other normal pregnancy, unspecified trimester: Secondary | ICD-10-CM

## 2016-08-25 DIAGNOSIS — Z3482 Encounter for supervision of other normal pregnancy, second trimester: Secondary | ICD-10-CM

## 2016-08-25 NOTE — Patient Instructions (Signed)
Safe Medications in Pregnancy   Acne: Benzoyl Peroxide Salicylic Acid  Backache/Headache: Tylenol: 2 regular strength every 4 hours OR              2 Extra strength every 6 hours  Colds/Coughs/Allergies: Benadryl (alcohol free) 25 mg every 6 hours as needed Breath right strips Claritin Cepacol throat lozenges Chloraseptic throat spray Cold-Eeze- up to three times per day Cough drops, alcohol free Flonase (by prescription only) Guaifenesin Mucinex Robitussin DM (plain only, alcohol free) Saline nasal spray/drops Sudafed (pseudoephedrine) & Actifed ** use only after [redacted] weeks gestation and if you do not have high blood pressure Tylenol Vicks Vaporub Zinc lozenges Zyrtec   Constipation: Colace Ducolax suppositories Fleet enema Glycerin suppositories Metamucil Milk of magnesia Miralax Senokot Smooth move tea  Diarrhea: Kaopectate Imodium A-D  *NO pepto Bismol  Hemorrhoids: Anusol Anusol HC Preparation H Tucks  Indigestion: Tums Maalox Mylanta Zantac  Pepcid  Insomnia: Benadryl (alcohol free) 25mg every 6 hours as needed Tylenol PM Unisom, no Gelcaps  Leg Cramps: Tums MagGel  Nausea/Vomiting:  Bonine Dramamine Emetrol Ginger extract Sea bands Meclizine  Nausea medication to take during pregnancy:  Unisom (doxylamine succinate 25 mg tablets) Take one tablet daily at bedtime. If symptoms are not adequately controlled, the dose can be increased to a maximum recommended dose of two tablets daily (1/2 tablet in the morning, 1/2 tablet mid-afternoon and one at bedtime). Vitamin B6 100mg tablets. Take one tablet twice a day (up to 200 mg per day).  Skin Rashes: Aveeno products Benadryl cream or 25mg every 6 hours as needed Calamine Lotion 1% cortisone cream  Yeast infection: Gyne-lotrimin 7 Monistat 7   **If taking multiple medications, please check labels to avoid duplicating the same active ingredients **take medication as directed on  the label ** Do not exceed 4000 mg of tylenol in 24 hours **Do not take medications that contain aspirin or ibuprofen     Second Trimester of Pregnancy The second trimester is from week 14 through week 27 (months 4 through 6). The second trimester is often a time when you feel your best. Your body has adjusted to being pregnant, and you begin to feel better physically. Usually, morning sickness has lessened or quit completely, you may have more energy, and you may have an increase in appetite. The second trimester is also a time when the fetus is growing rapidly. At the end of the sixth month, the fetus is about 9 inches long and weighs about 1 pounds. You will likely begin to feel the baby move (quickening) between 16 and 20 weeks of pregnancy. Body changes during your second trimester Your body continues to go through many changes during your second trimester. The changes vary from woman to woman.  Your weight will continue to increase. You will notice your lower abdomen bulging out.  You may begin to get stretch marks on your hips, abdomen, and breasts.  You may develop headaches that can be relieved by medicines. The medicines should be approved by your health care provider.  You may urinate more often because the fetus is pressing on your bladder.  You may develop or continue to have heartburn as a result of your pregnancy.  You may develop constipation because certain hormones are causing the muscles that push waste through your intestines to slow down.  You may develop hemorrhoids or swollen, bulging veins (varicose veins).  You may have back pain. This is caused by: ? Weight gain. ? Pregnancy hormones that are   relaxing the joints in your pelvis. ? A shift in weight and the muscles that support your balance.  Your breasts will continue to grow and they will continue to become tender.  Your gums may bleed and may be sensitive to brushing and flossing.  Dark spots or blotches  (chloasma, mask of pregnancy) may develop on your face. This will likely fade after the baby is born.  A dark line from your belly button to the pubic area (linea nigra) may appear. This will likely fade after the baby is born.  You may have changes in your hair. These can include thickening of your hair, rapid growth, and changes in texture. Some women also have hair loss during or after pregnancy, or hair that feels dry or thin. Your hair will most likely return to normal after your baby is born.  What to expect at prenatal visits During a routine prenatal visit:  You will be weighed to make sure you and the fetus are growing normally.  Your blood pressure will be taken.  Your abdomen will be measured to track your baby's growth.  The fetal heartbeat will be listened to.  Any test results from the previous visit will be discussed.  Your health care provider may ask you:  How you are feeling.  If you are feeling the baby move.  If you have had any abnormal symptoms, such as leaking fluid, bleeding, severe headaches, or abdominal cramping.  If you are using any tobacco products, including cigarettes, chewing tobacco, and electronic cigarettes.  If you have any questions.  Other tests that may be performed during your second trimester include:  Blood tests that check for: ? Low iron levels (anemia). ? High blood sugar that affects pregnant women (gestational diabetes) between 24 and 28 weeks. ? Rh antibodies. This is to check for a protein on red blood cells (Rh factor).  Urine tests to check for infections, diabetes, or protein in the urine.  An ultrasound to confirm the proper growth and development of the baby.  An amniocentesis to check for possible genetic problems.  Fetal screens for spina bifida and Down syndrome.  HIV (human immunodeficiency virus) testing. Routine prenatal testing includes screening for HIV, unless you choose not to have this test.  Follow  these instructions at home: Medicines  Follow your health care provider's instructions regarding medicine use. Specific medicines may be either safe or unsafe to take during pregnancy.  Take a prenatal vitamin that contains at least 600 micrograms (mcg) of folic acid.  If you develop constipation, try taking a stool softener if your health care provider approves. Eating and drinking  Eat a balanced diet that includes fresh fruits and vegetables, whole grains, good sources of protein such as meat, eggs, or tofu, and low-fat dairy. Your health care provider will help you determine the amount of weight gain that is right for you.  Avoid raw meat and uncooked cheese. These carry germs that can cause birth defects in the baby.  If you have low calcium intake from food, talk to your health care provider about whether you should take a daily calcium supplement.  Limit foods that are high in fat and processed sugars, such as fried and sweet foods.  To prevent constipation: ? Drink enough fluid to keep your urine clear or pale yellow. ? Eat foods that are high in fiber, such as fresh fruits and vegetables, whole grains, and beans. Activity  Exercise only as directed by your health care provider.   Most women can continue their usual exercise routine during pregnancy. Try to exercise for 30 minutes at least 5 days a week. Stop exercising if you experience uterine contractions.  Avoid heavy lifting, wear low heel shoes, and practice good posture.  A sexual relationship may be continued unless your health care provider directs you otherwise. Relieving pain and discomfort  Wear a good support bra to prevent discomfort from breast tenderness.  Take warm sitz baths to soothe any pain or discomfort caused by hemorrhoids. Use hemorrhoid cream if your health care provider approves.  Rest with your legs elevated if you have leg cramps or low back pain.  If you develop varicose veins, wear support  hose. Elevate your feet for 15 minutes, 3-4 times a day. Limit salt in your diet. Prenatal Care  Write down your questions. Take them to your prenatal visits.  Keep all your prenatal visits as told by your health care provider. This is important. Safety  Wear your seat belt at all times when driving.  Make a list of emergency phone numbers, including numbers for family, friends, the hospital, and police and fire departments. General instructions  Ask your health care provider for a referral to a local prenatal education class. Begin classes no later than the beginning of month 6 of your pregnancy.  Ask for help if you have counseling or nutritional needs during pregnancy. Your health care provider can offer advice or refer you to specialists for help with various needs.  Do not use hot tubs, steam rooms, or saunas.  Do not douche or use tampons or scented sanitary pads.  Do not cross your legs for long periods of time.  Avoid cat litter boxes and soil used by cats. These carry germs that can cause birth defects in the baby and possibly loss of the fetus by miscarriage or stillbirth.  Avoid all smoking, herbs, alcohol, and unprescribed drugs. Chemicals in these products can affect the formation and growth of the baby.  Do not use any products that contain nicotine or tobacco, such as cigarettes and e-cigarettes. If you need help quitting, ask your health care provider.  Visit your dentist if you have not gone yet during your pregnancy. Use a soft toothbrush to brush your teeth and be gentle when you floss. Contact a health care provider if:  You have dizziness.  You have mild pelvic cramps, pelvic pressure, or nagging pain in the abdominal area.  You have persistent nausea, vomiting, or diarrhea.  You have a bad smelling vaginal discharge.  You have pain when you urinate. Get help right away if:  You have a fever.  You are leaking fluid from your vagina.  You have  spotting or bleeding from your vagina.  You have severe abdominal cramping or pain.  You have rapid weight gain or weight loss.  You have shortness of breath with chest pain.  You notice sudden or extreme swelling of your face, hands, ankles, feet, or legs.  You have not felt your baby move in over an hour.  You have severe headaches that do not go away when you take medicine.  You have vision changes. Summary  The second trimester is from week 14 through week 27 (months 4 through 6). It is also a time when the fetus is growing rapidly.  Your body goes through many changes during pregnancy. The changes vary from woman to woman.  Avoid all smoking, herbs, alcohol, and unprescribed drugs. These chemicals affect the formation and growth   your baby.  Do not use any tobacco products, such as cigarettes, chewing tobacco, and e-cigarettes. If you need help quitting, ask your health care provider.  Contact your health care provider if you have any questions. Keep all prenatal visits as told by your health care provider. This is important. This information is not intended to replace advice given to you by your health care provider. Make sure you discuss any questions you have with your health care provider. Document Released: 01/07/2001 Document Revised: 06/21/2015 Document Reviewed: 03/16/2012 Elsevier Interactive Patient Education  2017 [employer]  

## 2016-08-25 NOTE — Progress Notes (Signed)
   PRENATAL VISIT NOTE  Subjective:  Annette Steele is a 21 y.o. G2P1001 at 5121w6d being seen today for ongoing prenatal care.  She is currently monitored for the following issues for this low-risk pregnancy and has Supervision of other normal pregnancy, antepartum and GBS (group b Streptococcus) UTI complicating pregnancy, first trimester on her problem list.  Patient reports chronic cough that causes her to spit up mucous sometimes.  She is taking Mucinex and Whole Foodsussalon Perles with some relief..  Contractions: Not present. Vag. Bleeding: None.  Movement: Present. Denies leaking of fluid.   The following portions of the patient's history were reviewed and updated as appropriate: allergies, current medications, past family history, past medical history, past social history, past surgical history and problem list. Problem list updated.  Objective:   Vitals:   08/25/16 0755  BP: 120/80  Pulse: (!) 105  Weight: 166 lb 3.2 oz (75.4 kg)    Fetal Status: Fetal Heart Rate (bpm): 160 Fundal Height: 20 cm Movement: Present     General:  Alert, oriented and cooperative. Patient is in no acute distress.  Skin: Skin is warm and dry. No rash noted.   Cardiovascular: Normal heart rate noted  Respiratory: Normal respiratory effort, no problems with respiration noted  Abdomen: Soft, gravid, appropriate for gestational age.  Pain/Pressure: Present     Pelvic: Cervical exam deferred        Extremities: Normal range of motion.  Edema: None  Mental Status:  Normal mood and affect. Normal behavior. Normal judgment and thought content.   Assessment and Plan:  Pregnancy: G2P1001 at 2821w6d  1. Supervision of other normal pregnancy, antepartum  - US MFM OB COMP + 14 WK; Future  2. Cough  - Safe Medications in Pregnancy List given  Preterm labor symptoms and general obstetric precautions including but not limited to vaginal bleeding, contractions, leaking of fluid and fetal movement were reviewed in  detail with the patient. Please refer to After Visit Summary for other counseling recommendations.  Return in about 1 month (around 09/22/2016) for Return OB visit.   Raelyn Moraolitta Odell Fasching, CNM

## 2016-08-25 NOTE — Progress Notes (Signed)
Breastfeeding education done 

## 2016-08-28 ENCOUNTER — Telehealth: Payer: Self-pay | Admitting: *Deleted

## 2016-08-28 NOTE — Telephone Encounter (Signed)
Attempting to get prior auth for OB US from [employer]. They tell me I can not get a prior auth because [place] and she needs [place] get [place].  I called Annette Steele and discussed with her and she states she knows and is trying to get this done.

## 2016-09-01 ENCOUNTER — Other Ambulatory Visit: Payer: Self-pay | Admitting: Obstetrics and Gynecology

## 2016-09-01 ENCOUNTER — Ambulatory Visit (HOSPITAL_COMMUNITY)
Admission: RE | Admit: 2016-09-01 | Discharge: 2016-09-01 | Disposition: A | Discharge status: Home / Self Care | Source: Ambulatory Visit | Attending: Obstetrics and Gynecology | Admitting: Obstetrics and Gynecology

## 2016-09-01 DIAGNOSIS — Z3482 Encounter for supervision of other normal pregnancy, second trimester: Secondary | ICD-10-CM | POA: Diagnosis not present

## 2016-09-01 DIAGNOSIS — Z3689 Encounter for other specified antenatal screening: Secondary | ICD-10-CM

## 2016-09-01 DIAGNOSIS — Z3480 Encounter for supervision of other normal pregnancy, unspecified trimester: Secondary | ICD-10-CM

## 2016-09-01 DIAGNOSIS — Z3A21 21 weeks gestation of pregnancy: Secondary | ICD-10-CM | POA: Diagnosis not present

## 2016-09-22 ENCOUNTER — Ambulatory Visit (INDEPENDENT_AMBULATORY_CARE_PROVIDER_SITE_OTHER): Admitting: Medical

## 2016-09-22 ENCOUNTER — Encounter: Payer: Self-pay | Admitting: Medical

## 2016-09-22 DIAGNOSIS — Z3480 Encounter for supervision of other normal pregnancy, unspecified trimester: Secondary | ICD-10-CM

## 2016-09-22 NOTE — Progress Notes (Signed)
Patient here today for Routine OB visit [redacted]w[redacted]d.  Patient complains of having nose bleeds for a month now that happens every morning. Patient also states she feels that she is dehydrated. I advised patient to try and drink water throughout the day.

## 2016-09-22 NOTE — Patient Instructions (Signed)
How a Baby Grows During Pregnancy Pregnancy begins when a female's sperm enters a female's egg (fertilization). This happens in one of the tubes (fallopian tubes) that connect the ovaries to the womb (uterus). The fertilized egg is called an embryo until it reaches 10 weeks. From 10 weeks until birth, it is called a fetus. The fertilized egg moves down the fallopian tube to the uterus. Then it implants into the lining of the uterus and begins to grow. The developing fetus receives oxygen and nutrients through the pregnant woman's bloodstream and the tissues that grow (placenta) to support the fetus. The placenta is the life support system for the fetus. It provides nutrition and removes waste. Learning as much as you can about your pregnancy and how your baby is developing can help you enjoy the experience. It can also make you aware of when there might be a problem and when to ask questions. How long does a typical pregnancy last? A pregnancy usually lasts 280 days, or about 40 weeks. Pregnancy is divided into three trimesters:  First trimester: 0-13 weeks.  Second trimester: 14-27 weeks.  Third trimester: 28-40 weeks.  The day when your baby is considered ready to be born (full term) is your estimated date of delivery. How does my baby develop month by month? First month  The fertilized egg attaches to the inside of the uterus.  Some cells will form the placenta. Others will form the fetus.  The arms, legs, brain, spinal cord, lungs, and heart begin to develop.  At the end of the first month, the heart begins to beat.  Second month  The bones, inner ear, eyelids, hands, and feet form.  The genitals develop.  By the end of 8 weeks, all major organs are developing.  Third month  All of the internal organs are forming.  Teeth develop below the gums.  Bones and muscles begin to grow. The spine can flex.  The skin is transparent.  Fingernails and toenails begin to form.  Arms  and legs continue to grow longer, and hands and feet develop.  The fetus is about 3 in (7.6 cm) long.  Fourth month  The placenta is completely formed.  The external sex organs, neck, outer ear, eyebrows, eyelids, and fingernails are formed.  The fetus can hear, swallow, and move its arms and legs.  The kidneys begin to produce urine.  The skin is covered with a white waxy coating (vernix) and very fine hair (lanugo).  Fifth month  The fetus moves around more and can be felt for the first time (quickening).  The fetus starts to sleep and wake up and may begin to suck its finger.  The nails grow to the end of the fingers.  The organ in the digestive system that makes bile (gallbladder) functions and helps to digest the nutrients.  If your baby is a girl, eggs are present in her ovaries. If your baby is a boy, testicles start to move down into his scrotum.  Sixth month  The lungs are formed, but the fetus is not yet able to breathe.  The eyes open. The brain continues to develop.  Your baby has fingerprints and toe prints. Your baby's hair grows thicker.  At the end of the second trimester, the fetus is about 9 in (22.9 cm) long.  Seventh month  The fetus kicks and stretches.  The eyes are developed enough to sense changes in light.  The hands can make a grasping motion.  The   fetus responds to sound.  Eighth month  All organs and body systems are fully developed and functioning.  Bones harden and taste buds develop. The fetus may hiccup.  Certain areas of the brain are still developing. The skull remains soft.  Ninth month  The fetus gains about  lb (0.23 kg) each week.  The lungs are fully developed.  Patterns of sleep develop.  The fetus's head typically moves into a head-down position (vertex) in the uterus to prepare for birth. If the buttocks move into a vertex position instead, the baby is breech.  The fetus weighs 6-9 lbs (2.72-4.08 kg) and is  19-20 in (48.26-50.8 cm) long.  What can I do to have a healthy pregnancy and help my baby develop? Eating and Drinking  Eat a healthy diet. ? Talk with your health care provider to make sure that you are getting the nutrients that you and your baby need. ? Visit www.choosemyplate.gov to learn about creating a healthy diet.  Gain a healthy amount of weight during pregnancy as advised by your health care provider. This is usually 25-35 pounds. You may need to: ? Gain more if you were underweight before getting pregnant or if you are pregnant with more than one baby. ? Gain less if you were overweight or obese when you got pregnant.  Medicines and Vitamins  Take prenatal vitamins as directed by your health care provider. These include vitamins such as folic acid, iron, calcium, and vitamin D. They are important for healthy development.  Take medicines only as directed by your health care provider. Read labels and ask a pharmacist or your health care provider whether over-the-counter medicines, supplements, and prescription drugs are safe to take during pregnancy.  Activities  Be physically active as advised by your health care provider. Ask your health care provider to recommend activities that are safe for you to do, such as walking or swimming.  Do not participate in strenuous or extreme sports.  Lifestyle  Do not drink alcohol.  Do not use any tobacco products, including cigarettes, chewing tobacco, or electronic cigarettes. If you need help quitting, ask your health care provider.  Do not use illegal drugs.  Safety  Avoid exposure to mercury, lead, or other heavy metals. Ask your health care provider about common sources of these heavy metals.  Avoid listeria infection during pregnancy. Follow these precautions: ? Do not eat soft cheeses or deli meats. ? Do not eat hot dogs unless they have been warmed up to the point of steaming, such as in the microwave oven. ? Do not  drink unpasteurized milk.  Avoid toxoplasmosis infection during pregnancy. Follow these precautions: ? Do not change your cat's litter box, if you have a cat. Ask someone else to do this for you. ? Wear gardening gloves while working in the yard.  General Instructions  Keep all follow-up visits as directed by your health care provider. This is important. This includes prenatal care and screening tests.  Manage any chronic health conditions. Work closely with your health care provider to keep conditions, such as diabetes, under control.  How do I know if my baby is developing well? At each prenatal visit, your health care provider will do several different tests to check on your health and keep track of your baby's development. These include:  Fundal height. ? Your health care provider will measure your growing belly from top to bottom using a tape measure. ? Your health care provider will also feel your belly   to determine your baby's position.  Heartbeat. ? An ultrasound in the first trimester can confirm pregnancy and show a heartbeat, depending on how far along you are. ? Your health care provider will check your baby's heart rate at every prenatal visit. ? As you get closer to your delivery date, you may have regular fetal heart rate monitoring to make sure that your baby is not in distress.  Second trimester ultrasound. ? This ultrasound checks your baby's development. It also indicates your baby's gender.  What should I do if I have concerns about my baby's development? Always talk with your health care provider about any concerns that you may have. This information is not intended to replace advice given to you by your health care provider. Make sure you discuss any questions you have with your health care provider. Document Released: 07/02/2007 Document Revised: 06/21/2015 Document Reviewed: 06/22/2013 Elsevier Interactive Patient Education  2018 [employer]. [employer] of the uterus can occur throughout pregnancy, but they are not always a sign that you are in labor. You may have practice contractions called Braxton Hicks contractions. These false labor contractions are sometimes confused with true labor. What are Deberah Pelton contractions? Braxton Hicks contractions are tightening movements that occur in the muscles of the uterus before labor. Unlike true labor contractions, these contractions do not result in opening (dilation) and thinning of the cervix. Toward the end of pregnancy (32-34 weeks), Braxton Hicks contractions can happen more often and may become stronger. These contractions are sometimes difficult to tell apart from true labor because they can be very uncomfortable. You should not feel embarrassed if you go to the hospital with false labor. Sometimes, the only way to tell if you are in true labor is for your health care provider to look for changes in the cervix. The health care provider will do a physical exam and may monitor your contractions. If you are not in true labor, the exam should show that your cervix is not dilating and your water has not broken. If there are no prenatal problems or other health problems associated with your pregnancy, it is completely safe for you to be sent home with false labor. You may continue to have Braxton Hicks contractions until you go into true labor. How can I tell the difference between true labor and false labor?  Differences ? False labor ? Contractions last 30-70 seconds.: Contractions are usually shorter and not as strong as true labor contractions. ? Contractions become very regular.: Contractions are usually irregular. ? Discomfort is usually felt in the top of the uterus, and it spreads to the lower abdomen and low back.: Contractions are often felt in the front of the lower abdomen and in the groin. ? Contractions do not go away with walking.: Contractions may go away when  you walk around or change positions while lying down. ? Contractions usually become more intense and increase in frequency.: Contractions get weaker and are shorter-lasting as time goes on. ? The cervix dilates and gets thinner.: The cervix usually does not dilate or become thin. Follow these instructions at home:  Take over-the-counter and prescription medicines only as told by your health care provider.  Keep up with your usual exercises and follow other instructions from your health care provider.  Eat and drink lightly if you think you are going into labor.  If Braxton Hicks contractions are making you uncomfortable: ? Change your position from lying down or resting to walking, or  change from walking to resting. ? Sit and rest in a tub of warm water. ? Drink enough fluid to keep your urine clear or pale yellow. Dehydration may cause these contractions. ? Do slow and deep breathing several times an hour.  Keep all follow-up prenatal visits as told by your health care provider. This is important. Contact a health care provider if:  You have a fever.  You have continuous pain in your abdomen. Get help right away if:  Your contractions become stronger, more regular, and closer together.  You have fluid leaking or gushing from your vagina.  You pass blood-tinged mucus (bloody show).  You have bleeding from your vagina.  You have low back pain that you never had before.  You feel your baby's head pushing down and causing pelvic pressure.  Your baby is not moving inside you as much as it used to. Summary  Contractions that occur before labor are called Braxton Hicks contractions, false labor, or practice contractions.  Braxton Hicks contractions are usually shorter, weaker, farther apart, and less regular than true labor contractions. True labor contractions usually become progressively stronger and regular and they become more frequent.  Manage discomfort from [facility]  contractions by changing position, resting in a warm bath, drinking plenty of water, or practicing deep breathing. This information is not intended to replace advice given to you by your health care provider. Make sure you discuss any questions you have with your health care provider. Document Released: 01/13/2005 Document Revised: 12/03/2015 Document Reviewed: 12/03/2015 Elsevier Interactive Patient Education  2017 [employer].

## 2016-09-22 NOTE — Progress Notes (Signed)
   PRENATAL VISIT NOTE  Subjective:  Annette Steele is a 21 y.o. G2P1001 at [redacted]w[redacted]d being seen today for ongoing prenatal care.  She is currently monitored for the following issues for this low-risk pregnancy and has Supervision of other normal pregnancy, antepartum and GBS (group b Streptococcus) UTI complicating pregnancy, first trimester on her problem list.  Patient reports occasional contractions.  Contractions: Irregular. Vag. Bleeding: None.  Movement: Present. Denies leaking of fluid.   The following portions of the patient's history were reviewed and updated as appropriate: allergies, current medications, past family history, past medical history, past social history, past surgical history and problem list. Problem list updated.  Objective:   Vitals:   09/22/16 0853  BP: (!) 120/57  Pulse: 78  Weight: 167 lb 9.6 oz (76 kg)    Fetal Status: Fetal Heart Rate (bpm): 150 Fundal Height: 24 cm Movement: Present     General:  Alert, oriented and cooperative. Patient is in no acute distress.  Skin: Skin is warm and dry. No rash noted.   Cardiovascular: Normal heart rate noted  Respiratory: Normal respiratory effort, no problems with respiration noted  Abdomen: Soft, gravid, appropriate for gestational age.  Pain/Pressure: Present     Pelvic: Cervical exam deferred        Extremities: Normal range of motion.  Edema: Trace  Mental Status:  Normal mood and affect. Normal behavior. Normal judgment and thought content.   Assessment and Plan:  Pregnancy: G2P1001 at [redacted]w[redacted]d  1. Supervision of other normal pregnancy, antepartum - Doing well - Discussed need for GTT and third trimester labs at next visit  2. Nosebleeds - A few times/week over the last month - Advised that referral to ENT would be needed to fully evaluate and treat. Patient would like to wait to monitor before placing referral  3. Braxton hicks contractions - Patient states most days having small spurts of frequent  contractions every few minutes for ~ 30 minutes at a time - Contractions resolve and are mild to palpation - Offered cervical exam today, patient declines, she is not currently having contractions  - PTL warning signs discussed and advised to go to MAU if contractions continue or worsen  Preterm labor symptoms and general obstetric precautions including but not limited to vaginal bleeding, contractions, leaking of fluid and fetal movement were reviewed in detail with the patient. Please refer to After Visit Summary for other counseling recommendations.  Return in about 4 weeks (around 10/20/2016) for LOB with 2 hour GTT.   Vonzella Nipple, PA-C

## 2016-10-03 ENCOUNTER — Inpatient Hospital Stay (HOSPITAL_COMMUNITY)
Admission: AD | Admit: 2016-10-03 | Discharge: 2016-10-03 | Disposition: A | Discharge status: Home / Self Care | Source: Ambulatory Visit | Attending: Obstetrics and Gynecology | Admitting: Obstetrics and Gynecology

## 2016-10-03 DIAGNOSIS — B951 Streptococcus, group B, as the cause of diseases classified elsewhere: Secondary | ICD-10-CM

## 2016-10-03 DIAGNOSIS — Z3A26 26 weeks gestation of pregnancy: Secondary | ICD-10-CM | POA: Insufficient documentation

## 2016-10-03 DIAGNOSIS — N898 Other specified noninflammatory disorders of vagina: Secondary | ICD-10-CM | POA: Diagnosis not present

## 2016-10-03 DIAGNOSIS — O2341 Unspecified infection of urinary tract in pregnancy, first trimester: Secondary | ICD-10-CM

## 2016-10-03 DIAGNOSIS — O9989 Other specified diseases and conditions complicating pregnancy, childbirth and the puerperium: Secondary | ICD-10-CM

## 2016-10-03 DIAGNOSIS — Z3480 Encounter for supervision of other normal pregnancy, unspecified trimester: Secondary | ICD-10-CM

## 2016-10-03 DIAGNOSIS — O26892 Other specified pregnancy related conditions, second trimester: Secondary | ICD-10-CM | POA: Diagnosis not present

## 2016-10-03 LAB — WET PREP, GENITAL
CLUE CELLS WET PREP: NONE SEEN
Sperm: NONE SEEN
TRICH WET PREP: NONE SEEN
Yeast Wet Prep HPF POC: NONE SEEN

## 2016-10-03 LAB — URINALYSIS, ROUTINE W REFLEX MICROSCOPIC
Bilirubin Urine: NEGATIVE
Glucose, UA: NEGATIVE mg/dL
Hgb urine dipstick: NEGATIVE
KETONES UR: NEGATIVE mg/dL
LEUKOCYTES UA: NEGATIVE
NITRITE: NEGATIVE
PH: 7 (ref 5.0–8.0)
Protein, ur: NEGATIVE mg/dL
Specific Gravity, Urine: 1.015 (ref 1.005–1.030)

## 2016-10-03 LAB — AMNISURE RUPTURE OF MEMBRANE (ROM) NOT AT ARMC: Amnisure ROM: NEGATIVE

## 2016-10-03 NOTE — MAU Note (Signed)
?   D/c or leaking, watery d/c smells sweet. Also having [name], random- just had first pain now.

## 2016-10-03 NOTE — Discharge Instructions (Signed)

## 2016-10-03 NOTE — MAU Provider Note (Signed)
Chief Complaint:  Contractions and Vaginal Discharge   HPI: Annette Steele is a 21 y.o. G2P1001 at 2319w3d who presents to maternity admissions reporting vaginal leaking. Pt says that she has been noticing a clear running fluid on the toilet paper when she wipes for a few days. She says that the fluid coming from her vagina started of clear but was a cloudy milky color when she checked yesterday and had a sweet odor. Pt notes that she has not had a gush of fluid and has not had to change underwear or wear panty liners since she started noticing the fluid leaking. However, the pt did note an incident when she was laying down and got up to notice that her underwear was wet and that there was a small pool of fluid on the bed. She reports having braxton contractions, good fetal movements, and having intercourse last week. She denies vaginal bleeding, dysuria, pyuria, hematuria, fever.   Pregnancy Course:  [facility]  Past Medical History: Past Medical History:  Diagnosis Date  . Heart murmur     Past obstetric history: OB History  Gravida Para Term Preterm AB Living  2 1 1     1   SAB TAB Ectopic Multiple Live Births          1    # Outcome Date GA Lbr Len/2nd Weight Sex Delivery Anes PTL Lv  2 Current           1 Term 04/08/12   3.175 kg (7 lb) M Vag-Spont  N LIV      Past Surgical History: Past Surgical History:  Procedure Laterality Date  . ESOPHAGOGASTRODUODENOSCOPY    . WISDOM TOOTH EXTRACTION       Family History: Family History  Problem Relation Age of Onset  . Diabetes Father   . Diabetes Sister     Social History: Social History  Substance Use Topics  . Smoking status: Never Smoker  . Smokeless tobacco: Never Used  . Alcohol use No    Allergies: No Known Allergies  Meds:  Prescriptions Prior to Admission  Medication Sig Dispense Refill Last Dose  . acetaminophen (TYLENOL) 500 MG tablet Take 1,000 mg by mouth every 6 (six) hours as needed for mild pain,  moderate pain, fever or headache.   Not Taking  . benzonatate (TESSALON) 100 MG capsule Take 1 capsule (100 mg total) by mouth every 8 (eight) hours. (Patient not taking: Reported on 09/22/2016) 21 capsule 0 Not Taking  . cetirizine (ZYRTEC) 10 MG tablet Take 10 mg by mouth daily as needed for allergies.   Taking  . cyclobenzaprine (FLEXERIL) 10 MG tablet Take 1 tablet (10 mg total) by mouth 2 (two) times daily as needed for muscle spasms. (Patient not taking: Reported on 08/25/2016) 10 tablet 0 Not Taking  . promethazine (PHENERGAN) 25 MG tablet Take 0.5-1 tablets (12.5-25 mg total) by mouth every 6 (six) hours as needed. (Patient not taking: Reported on 08/25/2016) 30 tablet 0 Not Taking    I have reviewed patient's Past Medical Hx, Surgical Hx, Family Hx, Social Hx, medications and allergies.   ROS:  Review of Systems  Constitutional: Negative.   HENT: Negative.   Respiratory: Negative.   Cardiovascular: Negative.   Gastrointestinal: Negative.   Genitourinary: Positive for vaginal discharge.  Neurological: Negative.     Physical Exam  Patient Vitals for the past 24 hrs:  BP Temp Temp src Pulse Resp SpO2 Weight  10/03/16 1049 (!) 121/58 98.2 F (36.8  C) Oral 88 16 100 % 75.8 kg (167 lb)   Constitutional: Well-developed, well-nourished female in no acute distress.  Cardiovascular: normal rate Respiratory: normal effort GI: Abd soft, non-tender, gravid appropriate for gestational age. MS: Extremities nontender, no edema, normal ROM Neurologic: Alert and oriented x 4.  Pelvic: NEFG, white watery discharge in vaginal canal, no blood, no pooling of fluid, no cervical motion tenderness, cervix closed and thick.   FHT:  Baseline 130 , moderate variability, accelerations present, no decelerations, no contractions.   Labs: Results for orders placed or performed during the hospital encounter of 10/03/16 (from the past 24 hour(s))  Urinalysis, Routine w reflex microscopic     Status:  Abnormal   Collection Time: 10/03/16 10:52 AM  Result Value Ref Range   Color, Urine YELLOW YELLOW   APPearance CLOUDY (A) CLEAR   Specific Gravity, Urine 1.015 1.005 - 1.030   pH 7.0 5.0 - 8.0   Glucose, UA NEGATIVE NEGATIVE mg/dL   Hgb urine dipstick NEGATIVE NEGATIVE   Bilirubin Urine NEGATIVE NEGATIVE   Ketones, ur NEGATIVE NEGATIVE mg/dL   Protein, ur NEGATIVE NEGATIVE mg/dL   Nitrite NEGATIVE NEGATIVE   Leukocytes, UA NEGATIVE NEGATIVE  Wet prep, genital     Status: Abnormal   Collection Time: 10/03/16 11:40 AM  Result Value Ref Range   Yeast Wet Prep HPF POC NONE SEEN NONE SEEN   Trich, Wet Prep NONE SEEN NONE SEEN   Clue Cells Wet Prep HPF POC NONE SEEN NONE SEEN   WBC, Wet Prep HPF POC MANY (A) NONE SEEN   Sperm NONE SEEN   Amnisure rupture of membrane (rom)not at [facility]     Status: None   Collection Time: 10/03/16 11:40 AM  Result Value Ref Range   Amnisure ROM NEGATIVE     Imaging:  No imaging done today  MAU Course: -Wet prep - negative -Fern Test- negative -Amnisure ROM - negative -UA - normal   MDM:  Assessment/Plan: 1. Supervision of normal pregnancy, antepartum Next prenatal visit 10/20/16  2. Vaginal Discharge Based on exam and negative lab results from the fern fest and amnisure, the patient is likely experiencing normal discharge of pregnancy. PPROM is unlikely. It was initially a concern with the recent vaginal discharge incident the patient described, but it is likely that the small pool of fluid was urine. On exam the cervix was thick and closed, which reduces suspicion of preterm labor.  Pre-term labor precautions discussed; reviewed when to return to the MAU.   Discharge home in stable condition.    Richardo Hanks, Medical Student 10/03/2016 11:47 AM   I confirm that I have verified the information documented in the student's note and that I have also personally reperformed the physical exam and all medical decision making  activities. Luna Kitchens CNM

## 2016-10-20 ENCOUNTER — Ambulatory Visit (INDEPENDENT_AMBULATORY_CARE_PROVIDER_SITE_OTHER): Admitting: Certified Nurse Midwife

## 2016-10-20 VITALS — BP 121/73 | HR 85 | Wt 171.5 lb

## 2016-10-20 DIAGNOSIS — Z23 Encounter for immunization: Secondary | ICD-10-CM

## 2016-10-20 DIAGNOSIS — Z3483 Encounter for supervision of other normal pregnancy, third trimester: Secondary | ICD-10-CM

## 2016-10-20 DIAGNOSIS — Z3480 Encounter for supervision of other normal pregnancy, unspecified trimester: Secondary | ICD-10-CM

## 2016-10-20 NOTE — Patient Instructions (Signed)
AREA PEDIATRIC/FAMILY PRACTICE PHYSICIANS  [facility] [address] [place], [place] [address] Phone - [phone]   Fax - [phone]  [facility] [address] [place], [place]  [address] Phone - [phone]   Fax - [phone]  [facility] [address] [place], [place]  [address] Phone - [phone]   Fax - [phone]  [facility] [address] [place], [place]  [address] Phone - [phone]   Fax - [phone]  [facility] [address], [address] [place], [place]  [address] Phone - [phone]   Fax - [phone]  [facility] [address], [address] [place], [place]  [address] Phone - [phone]   Fax - [phone]  [facility] [address] [place], [place]  [address] Phone - [phone]   Fax - [phone]  [facility] [address], [address] [place], [place]  [address] Phone - [phone]   Fax - [phone]  [facility] [address] [place], [place]  [address] Phone - [phone]   Fax - [phone] [facility] [address] [place], [place]  [address] Phone - [phone]   Fax - [phone]  [facility] [address] [place], [place]  [address] Phone - [phone]   Fax - [phone]  [facility] [address] [place], [place]  [address] Phone - [phone]   Fax - [phone]  [facility] [address] [place], [place]  [address] Phone - [phone]   Fax - [phone]  [facility] [address] [place], [place]  [address] Phone - [phone]  [facility] [address] [place], [place]  [address] Phone - [phone]   Fax - [phone]  [facility] [address] [place], [place]  [address] Phone - [phone]   Fax - [phone]  [facility] [address] [place], [place]  [address] Phone - [phone]   Fax - [phone]  [facility] [address] [place], [place]  [address] Phone - [phone]   Fax - [phone]   [facility] [address] [place], [place]  [address] Phone - [phone]   Fax - [phone]  [facility] [address] [place], [place]  [address] Phone - [phone]   Fax - [phone]  [facility] [address] [place], [place]  [address] Phone - [phone]   Fax - [phone]  [facility] [address] , [place]  [address] Phone - [phone]   Fax - [phone]  [facility] [address] , [place]  [address] Phone - [phone]   Fax - [phone] [facility] [address] [place], [place]  [address] Phone - [phone]   Fax - [phone]  [facility] [address] [place], [place]  [address] Phone - [phone]   Fax - [phone]   [facility] [address] [place],   [address] Phone - [phone]   Fax - [phone]   

## 2016-10-20 NOTE — Progress Notes (Signed)
   PRENATAL VISIT NOTE  Subjective:  Annette Steele is a 21 y.o. G2P1001 at [redacted]w[redacted]d being seen today for ongoing prenatal care.  She is currently monitored for the following issues for this low-risk pregnancy and has Supervision of other normal pregnancy, antepartum and GBS (group b Streptococcus) UTI complicating pregnancy, first trimester on her problem list.  Patient reports no complaints.  Contractions: Irritability. Vag. Bleeding: None.  Movement: Present. Denies leaking of fluid.   The following portions of the patient's history were reviewed and updated as appropriate: allergies, current medications, past family history, past medical history, past social history, past surgical history and problem list. Problem list updated.  Objective:   Vitals:   10/20/16 0751  BP: 121/73  Pulse: 85  Weight: 77.8 kg (171 lb 8 oz)    Fetal Status: Fetal Heart Rate (bpm): 150 Fundal Height: 29 cm Movement: Present     General:  Alert, oriented and cooperative. Patient is in no acute distress.  Skin: Skin is warm and dry. No rash noted.   Cardiovascular: Normal heart rate noted  Respiratory: Normal respiratory effort, no problems with respiration noted  Abdomen: Soft, gravid, appropriate for gestational age.  Pain/Pressure: Present     Pelvic: Cervical exam deferred        Extremities: Normal range of motion.  Edema: None  Mental Status:  Normal mood and affect. Normal behavior. Normal judgment and thought content.   Assessment and Plan:  Pregnancy: G2P1001 at [redacted]w[redacted]d  1. Encounter for supervision of other normal pregnancy in third trimester Doing well, no complaints. Declines flu vaccine today. - Glucose Tolerance, 2 Hours w/1 Hour - CBC - HIV antibody (with reflex) - RPR - Tdap vaccine greater than or equal to 7yo IM  2. Need for diphtheria-tetanus-pertussis (Tdap) vaccine - Tdap vaccine greater than or equal to 7yo IM   Preterm labor symptoms and general obstetric precautions  including but not limited to vaginal bleeding, contractions, leaking of fluid and fetal movement were reviewed in detail with the patient. Please refer to After Visit Summary for other counseling recommendations.  Return in about 2 weeks (around 11/03/2016).   Rolm Bookbinder, DO

## 2016-10-21 LAB — CBC
HEMATOCRIT: 29.3 % — AB (ref 34.0–46.6)
HEMOGLOBIN: 10.1 g/dL — AB (ref 11.1–15.9)
MCH: 29.4 pg (ref 26.6–33.0)
MCHC: 34.5 g/dL (ref 31.5–35.7)
MCV: 85 fL (ref 79–97)
Platelets: 161 10*3/uL (ref 150–379)
RBC: 3.44 x10E6/uL — AB (ref 3.77–5.28)
RDW: 13.4 % (ref 12.3–15.4)
WBC: 10.5 10*3/uL (ref 3.4–10.8)

## 2016-10-21 LAB — GLUCOSE TOLERANCE, 2 HOURS W/ 1HR
Glucose, 1 hour: 139 mg/dL (ref 65–179)
Glucose, 2 hour: 93 mg/dL (ref 65–152)
Glucose, Fasting: 89 mg/dL (ref 65–91)

## 2016-10-21 LAB — RPR: RPR: NONREACTIVE

## 2016-10-21 LAB — HIV ANTIBODY (ROUTINE TESTING W REFLEX): HIV Screen 4th Generation wRfx: NONREACTIVE

## 2016-11-03 ENCOUNTER — Encounter (HOSPITAL_COMMUNITY): Payer: Self-pay

## 2016-11-03 ENCOUNTER — Inpatient Hospital Stay (HOSPITAL_COMMUNITY)
Admission: AD | Admit: 2016-11-03 | Discharge: 2016-11-03 | Disposition: A | Discharge status: Home / Self Care | Source: Ambulatory Visit | Attending: Obstetrics and Gynecology | Admitting: Obstetrics and Gynecology

## 2016-11-03 DIAGNOSIS — O26893 Other specified pregnancy related conditions, third trimester: Secondary | ICD-10-CM | POA: Insufficient documentation

## 2016-11-03 DIAGNOSIS — O26899 Other specified pregnancy related conditions, unspecified trimester: Secondary | ICD-10-CM

## 2016-11-03 DIAGNOSIS — Z3689 Encounter for other specified antenatal screening: Secondary | ICD-10-CM

## 2016-11-03 DIAGNOSIS — Z3A30 30 weeks gestation of pregnancy: Secondary | ICD-10-CM | POA: Diagnosis not present

## 2016-11-03 DIAGNOSIS — R109 Unspecified abdominal pain: Secondary | ICD-10-CM | POA: Diagnosis not present

## 2016-11-03 LAB — URINALYSIS, ROUTINE W REFLEX MICROSCOPIC
BILIRUBIN URINE: NEGATIVE
Glucose, UA: 50 mg/dL — AB
HGB URINE DIPSTICK: NEGATIVE
KETONES UR: 5 mg/dL — AB
LEUKOCYTES UA: NEGATIVE
Nitrite: NEGATIVE
Protein, ur: NEGATIVE mg/dL
SPECIFIC GRAVITY, URINE: 1.015 (ref 1.005–1.030)
pH: 6 (ref 5.0–8.0)

## 2016-11-03 NOTE — Discharge Instructions (Signed)

## 2016-11-03 NOTE — MAU Note (Signed)
Pt presents to MAU with complaints of contractions for approximately 8 hours about 5 mins apart. Denies any VB or LOF

## 2016-11-03 NOTE — MAU Provider Note (Signed)
History     CSN: [id number]  Arrival date and time: 11/03/16 4098   First Provider Initiated Contact with Patient 11/03/16 1700      Chief Complaint  Patient presents with  . Contractions   G2P1001 .6 wks here with ctx q 10 min. Ctx started this am after she was walking on the treadmill. Denies VB, vaginal discharge, or LOF. Reports good FM. She is hydrating well today- total of 6 bottles of water. No recent IC.   OB History    Gravida Para Term Preterm AB Living   SAB TAB Ectopic Multiple Live Births           1      Past Medical History:  Diagnosis Date  . Heart murmur     Past Surgical History:  Procedure Laterality Date  . ESOPHAGOGASTRODUODENOSCOPY    . WISDOM TOOTH EXTRACTION      Family History  Problem Relation Age of Onset  . Diabetes Father   . Diabetes Sister     Social History  Substance Use Topics  . Smoking status: Never Smoker  . Smokeless tobacco: Never Used  . Alcohol use No    Allergies: No Known Allergies  No prescriptions prior to admission.    Review of Systems  Gastrointestinal: Positive for abdominal pain.  Genitourinary: Negative for dysuria, vaginal bleeding and vaginal discharge.   Physical Exam   Blood pressure 123/65, pulse 97, temperature 98.2 F (36.8 C), resp. rate 18, weight 174 lb (78.9 kg), last menstrual period 04/01/2016.  Physical Exam  Constitutional: She is oriented to person, place, and time. She appears well-developed and well-nourished. No distress.  HENT:  Head: Normocephalic and atraumatic.  Neck: Normal range of motion.  Respiratory: Effort normal. No respiratory distress.  GI: Soft. She exhibits no distension. There is no tenderness.  gravid  Genitourinary:  Genitourinary Comments: Cervix closed/thick  Musculoskeletal: Normal range of motion.  Neurological: She is alert and oriented to person, place, and time.  Skin: Skin is warm and dry.  Psychiatric: She has a normal mood and  affect.  EFM: 150 bpm, mod variability, + accels, no decels Toco: irritability  Results for orders placed or performed during the hospital encounter of 11/03/16 (from the past 24 hour(s))  Urinalysis, Routine w reflex microscopic     Status: Abnormal   Collection Time: 11/03/16  4:45 PM  Result Value Ref Range   Color, Urine YELLOW YELLOW   APPearance CLEAR CLEAR   Specific Gravity, Urine 1.015 1.005 - 1.030   pH 6.0 5.0 - 8.0   Glucose, UA 50 (A) NEGATIVE mg/dL   Hgb urine dipstick NEGATIVE NEGATIVE   Bilirubin Urine NEGATIVE NEGATIVE   Ketones, ur 5 (A) NEGATIVE mg/dL   Protein, ur NEGATIVE NEGATIVE mg/dL   Nitrite NEGATIVE NEGATIVE   Leukocytes, UA NEGATIVE NEGATIVE   RBC / HPF 0-5 0 - 5 RBC/hpf   WBC, UA 0-5 0 - 5 WBC/hpf   Bacteria, UA RARE (A) NONE SEEN   Squamous Epithelial / LPF 0-5 (A) NONE SEEN   Mucus PRESENT    Ca Oxalate Crys, UA PRESENT    MAU Course  Procedures  MDM Labs ordered and reviewed. No evidence of UTI pr PTL. Stable for discharge home.  Assessment and Plan   1. [redacted] weeks gestation of pregnancy   2. NST (non-stress test) reactive   3. Cramping affecting pregnancy, antepartum    Discharge home  Follow up in OB office as scheduled PTL precautions Rest  Hydrate  Allergies as of 11/03/2016   No Known Allergies     Medication List    TAKE these medications   acetaminophen 500 MG tablet Commonly known as:  TYLENOL Take 1,000 mg by mouth every 6 (six) hours as needed for mild pain, moderate pain, fever or headache.   cetirizine 10 MG tablet Commonly known as:  ZYRTEC Take 10 mg by mouth daily as needed for allergies.      Donette Larry, CNM 11/03/2016, 6:16 PM

## 2016-11-04 ENCOUNTER — Ambulatory Visit (INDEPENDENT_AMBULATORY_CARE_PROVIDER_SITE_OTHER): Admitting: Medical

## 2016-11-04 VITALS — BP 117/73 | HR 100 | Wt 173.8 lb

## 2016-11-04 DIAGNOSIS — Z23 Encounter for immunization: Secondary | ICD-10-CM | POA: Diagnosis not present

## 2016-11-04 DIAGNOSIS — B951 Streptococcus, group B, as the cause of diseases classified elsewhere: Secondary | ICD-10-CM

## 2016-11-04 DIAGNOSIS — Z3480 Encounter for supervision of other normal pregnancy, unspecified trimester: Secondary | ICD-10-CM

## 2016-11-04 DIAGNOSIS — O2341 Unspecified infection of urinary tract in pregnancy, first trimester: Secondary | ICD-10-CM

## 2016-11-04 NOTE — Patient Instructions (Addendum)
Fetal Movement Counts Patient Name: ________________________________________________ Patient Due Date: ____________________ What is a fetal movement count? A fetal movement count is the number of times that you feel your baby move during a certain amount of time. This may also be called a fetal kick count. A fetal movement count is recommended for every pregnant woman. You may be asked to start counting fetal movements as early as week 28 of your pregnancy. Pay attention to when your baby is most active. You may notice your baby's sleep and wake cycles. You may also notice things that make your baby move more. You should do a fetal movement count:  When your baby is normally most active.  At the same time each day.  A good time to count movements is while you are resting, after having something to eat and drink. How do I count fetal movements? 1. Find a quiet, comfortable area. Sit, or lie down on your side. 2. Write down the date, the start time and stop time, and the number of movements that you felt between those two times. Take this information with you to your health care visits. 3. For 2 hours, count kicks, flutters, swishes, rolls, and jabs. You should feel at least 10 movements during 2 hours. 4. You may stop counting after you have felt 10 movements. 5. If you do not feel 10 movements in 2 hours, have something to eat and drink. Then, keep resting and counting for 1 hour. If you feel at least 4 movements during that hour, you may stop counting. Contact a health care provider if:  You feel fewer than 4 movements in 2 hours.  Your baby is not moving like he or she usually does. Date: ____________ Start time: ____________ Stop time: ____________ Movements: ____________ Date: ____________ Start time: ____________ Stop time: ____________ Movements: ____________ Date: ____________ Start time: ____________ Stop time: ____________ Movements: ____________ Date: ____________ Start time:  ____________ Stop time: ____________ Movements: ____________ Date: ____________ Start time: ____________ Stop time: ____________ Movements: ____________ Date: ____________ Start time: ____________ Stop time: ____________ Movements: ____________ Date: ____________ Start time: ____________ Stop time: ____________ Movements: ____________ Date: ____________ Start time: ____________ Stop time: ____________ Movements: ____________ Date: ____________ Start time: ____________ Stop time: ____________ Movements: ____________ This information is not intended to replace advice given to you by your health care provider. Make sure you discuss any questions you have with your health care provider. Document Released: 02/12/2006 Document Revised: 09/12/2015 Document Reviewed: 02/22/2015 Elsevier Interactive Patient Education  2018 [employer]. [employer] of the uterus can occur throughout pregnancy, but they are not always a sign that you are in labor. You may have practice contractions called Braxton Hicks contractions. These false labor contractions are sometimes confused with true labor. What are [place] contractions? Braxton Hicks contractions are tightening movements that occur in the muscles of the uterus before labor. Unlike true labor contractions, these contractions do not result in opening (dilation) and thinning of the cervix. Toward the end of pregnancy (32-34 weeks), Braxton Hicks contractions can happen more often and may become stronger. These contractions are sometimes difficult to tell apart from true labor because they can be very uncomfortable. You should not feel embarrassed if you go to the hospital with false labor. Sometimes, the only way to tell if you are in true labor is for your health care provider to look for changes in the cervix. The health care provider will do a physical exam and may monitor your contractions. If  you are not in true labor, the exam  should show that your cervix is not dilating and your water has not broken. If there are no prenatal problems or other health problems associated with your pregnancy, it is completely safe for you to be sent home with false labor. You may continue to have Braxton Hicks contractions until you go into true labor. How can I tell the difference between true labor and false labor?  Differences ? False labor ? Contractions last 30-70 seconds.: Contractions are usually shorter and not as strong as true labor contractions. ? Contractions become very regular.: Contractions are usually irregular. ? Discomfort is usually felt in the top of the uterus, and it spreads to the lower abdomen and low back.: Contractions are often felt in the front of the lower abdomen and in the groin. ? Contractions do not go away with walking.: Contractions may go away when you walk around or change positions while lying down. ? Contractions usually become more intense and increase in frequency.: Contractions get weaker and are shorter-lasting as time goes on. ? The cervix dilates and gets thinner.: The cervix usually does not dilate or become thin. Follow these instructions at home:  Take over-the-counter and prescription medicines only as told by your health care provider.  Keep up with your usual exercises and follow other instructions from your health care provider.  Eat and drink lightly if you think you are going into labor.  If Braxton Hicks contractions are making you uncomfortable: ? Change your position from lying down or resting to walking, or change from walking to resting. ? Sit and rest in a tub of warm water. ? Drink enough fluid to keep your urine clear or pale yellow. Dehydration may cause these contractions. ? Do slow and deep breathing several times an hour.  Keep all follow-up prenatal visits as told by your health care provider. This is important. Contact a health care provider if:  You have a  fever.  You have continuous pain in your abdomen. Get help right away if:  Your contractions become stronger, more regular, and closer together.  You have fluid leaking or gushing from your vagina.  You pass blood-tinged mucus (bloody show).  You have bleeding from your vagina.  You have low back pain that you never had before.  You feel your baby's head pushing down and causing pelvic pressure.  Your baby is not moving inside you as much as it used to. Summary  Contractions that occur before labor are called Braxton Hicks contractions, false labor, or practice contractions.  Braxton Hicks contractions are usually shorter, weaker, farther apart, and less regular than true labor contractions. True labor contractions usually become progressively stronger and regular and they become more frequent.  Manage discomfort from [facility] contractions by changing position, resting in a warm bath, drinking plenty of water, or practicing deep breathing. This information is not intended to replace advice given to you by your health care provider. Make sure you discuss any questions you have with your health care provider. Document Released: 01/13/2005 Document Revised: 12/03/2015 Document Reviewed: 12/03/2015 Elsevier Interactive Patient Education  2017 Elsevier [employer].  AREA PEDIATRIC/FAMILY PRACTICE PHYSICIANS  [facility] [address] [place], [place]  [address] Phone - [phone]   Fax - [phone]  [facility] [address] [place], [place] [address] Phone - [phone]   Fax - [phone]  [facility] [address] [place],  [place]  [address] Phone - [phone]   Fax - [phone]  [facility] [address] [place], [place]  [address] Phone - [phone]   Fax - [phone]  [facility] [address] [place], [place]  [address] Phone - [phone]   Fax -  [phone]  [facility] [address] [place], [place]  [address] Phone - [phone]   Fax - [phone]  [facility] [place] [address] [place], [place]  [address] Phone - [phone]   Fax - [phone]  [facility] AT [facility] [address] [place], [address] [place], [place]  [address] Phone - [phone]   Fax - [phone]  [facility] AT [facility] [address] [place], [place]  [address] Phone - [phone]   Fax - [phone] [facility] AT [place] [address] [place], [place]  [address] Phone - [phone]   Fax - [phone]  [facility] [address] [place], [place]  [address] Phone - [phone]   Fax - [phone]  [facility] [address] [place], [place]  [address] Phone - [phone]   Fax - [phone]  [facility] [place] [address] [place], [place]  [address] Phone - [phone]   Fax - [phone]  [facility] [address], [address] [place], [place]  [address] Phone - [phone]  [facility] [address] [place], [place]  [address] Phone - [phone]   Fax - [phone]  [facility] [address] [place], [place]  [address] Phone - [phone]   Fax - [phone]  [facility] [address] [place], [place]  [address] Phone - [phone]   Fax - [phone]  [facility] [address] [place], [place]  [address] Phone - [phone]   Fax - [phone]   [facility] [address] [place] [address] [place], [place]  [address] Phone - [phone]   Fax - [phone]  [facility] [address] [place], [place]  [address] Phone - [phone]   Fax - [phone]  DAVID RUBIN [address] [place], [place]  [address] Phone - [phone]   Fax - [phone]  [facility] [address] [place], [place]  [address] Phone - [phone]   Fax - [phone]  [place] [facility] Alita Chyle [address] [place], [place]  [address] Phone - [phone]   Fax - [phone] Gerarda Fraction [address] [place], [place]  [address] Phone - [phone]   Fax - [phone]  [facility] [address] [place], [place]  [address] Phone - [phone]   Fax - [phone]  [facility] - [place] [address] [place], [place]  [address] Phone - [phone]   Fax - [phone]  [facility] [place] Wyvonne Lenz MD [address] [place] [address] Phone [phone]  Fax [phone]

## 2016-11-04 NOTE — Progress Notes (Signed)
Flu vaccine given.

## 2016-11-04 NOTE — Progress Notes (Signed)
   PRENATAL VISIT NOTE  Subjective:  Annette Steele is a 21 y.o. G2P1001 at [redacted]w[redacted]d being seen today for ongoing prenatal care.  She is currently monitored for the following issues for this low-risk pregnancy and has Supervision of other normal pregnancy, antepartum and GBS (group b Streptococcus) UTI complicating pregnancy, first trimester on her problem list.  Patient reports occasional contractions.  Contractions: Irritability. Vag. Bleeding: None.  Movement: Present. Denies leaking of fluid.   The following portions of the patient's history were reviewed and updated as appropriate: allergies, current medications, past family history, past medical history, past social history, past surgical history and problem list. Problem list updated.  Objective:   Vitals:   11/04/16 1031  BP: 117/73  Pulse: 100  Weight: 173 lb 12.8 oz (78.8 kg)    Fetal Status: Fetal Heart Rate (bpm): 160 Fundal Height: 31 cm Movement: Present     General:  Alert, oriented and cooperative. Patient is in no acute distress.  Skin: Skin is warm and dry. No rash noted.   Cardiovascular: Normal heart rate noted  Respiratory: Normal respiratory effort, no problems with respiration noted  Abdomen: Soft, gravid, appropriate for gestational age.  Pain/Pressure: Present     Pelvic: Cervical exam deferred        Extremities: Normal range of motion.  Edema: Trace  Mental Status:  Normal mood and affect. Normal behavior. Normal judgment and thought content.   Assessment and Plan:  Pregnancy: G2P1001 at [redacted]w[redacted]d  1. Supervision of other normal pregnancy, antepartum - Doing well - Seen in MAU yesterday with cramping. Cervix closed. Mild intermittent pain now. No bleeding, LOF - Discussed breast feeding classes and options for hospital tour - List of pediatricians given   2. GBS (group b Streptococcus) UTI complicating pregnancy, first trimester - Will treat in labor  3. Flu vaccine need - Flu Vaccine QUAD 36+ mos  IM  Preterm labor symptoms and general obstetric precautions including but not limited to vaginal bleeding, contractions, leaking of fluid and fetal movement were reviewed in detail with the patient. Please refer to After Visit Summary for other counseling recommendations.  Return in about 2 weeks (around 11/18/2016) for LOB.   Vonzella Nipple, PA-C

## 2016-11-19 ENCOUNTER — Ambulatory Visit (INDEPENDENT_AMBULATORY_CARE_PROVIDER_SITE_OTHER): Admitting: Advanced Practice Midwife

## 2016-11-19 VITALS — BP 111/64 | HR 95 | Wt 174.4 lb

## 2016-11-19 DIAGNOSIS — O2341 Unspecified infection of urinary tract in pregnancy, first trimester: Secondary | ICD-10-CM

## 2016-11-19 DIAGNOSIS — O2343 Unspecified infection of urinary tract in pregnancy, third trimester: Secondary | ICD-10-CM

## 2016-11-19 DIAGNOSIS — B951 Streptococcus, group B, as the cause of diseases classified elsewhere: Secondary | ICD-10-CM

## 2016-11-19 DIAGNOSIS — Z3480 Encounter for supervision of other normal pregnancy, unspecified trimester: Secondary | ICD-10-CM

## 2016-11-19 DIAGNOSIS — Z3483 Encounter for supervision of other normal pregnancy, third trimester: Secondary | ICD-10-CM

## 2016-11-19 NOTE — Patient Instructions (Addendum)
AREA PEDIATRIC/FAMILY PRACTICE PHYSICIANS  [facility] [address], [address] [place], [place]  [address] Phone - [phone]   Fax - [phone]  [facility] [address] [place], [place] [address] Phone - [phone]   Fax - [phone]  [facility] [address] [place], [place]  [address] Phone - [phone]   Fax - [phone]  [facility] [address] [place], [place]  [address] Phone - [phone]   Fax - [phone]  [facility] [address] [place], [place]  [address] Phone - [phone]   Fax - [phone]  [facility] [address] [place], [place]  [address] Phone - [phone]   Fax - [phone]  [facility] [address] [place], [place]  [address] Phone - [phone]   Fax - [phone]  [facility] [address], [address] [place], [place]  [address] Phone - [phone]   Fax - [phone]  [facility] [address] [place], [place]  [address] Phone - [phone]   Fax - [phone] [facility] [address] [place], [place]  [address] Phone - [phone]   Fax - [phone]  [facility] [address] [place], [place]  [address] Phone - [phone]   Fax - [phone]  [facility] [address] [place], [place]  [address] Phone - [phone]   Fax - [phone]  [facility] [address] [place], [place]  [address] Phone - [phone]   Fax - [phone]  SHILPA GOSRANI [address] [place], [place]  [address] Phone - [phone]  [facility] [address] [place], [place]  [address] Phone - [phone]   Fax - [phone]  [facility] [address] [place], [place]  [address] Phone - [phone]   Fax - [phone]  [facility] [address] [place], [place]  [address] Phone - [phone]   Fax - [phone]  [facility] [address] [place], [place]  [address] Phone - [phone]   Fax - [phone]   [facility] [address] [place], [place]  [address] Phone - [phone]   Fax - [phone]  [facility] [address] [place], [place]  [address] Phone - [phone]   Fax - [phone]  [facility] RUB[facility] [address] [place], [place]  [address] Phone - [phone]   Fax - [phone]  [facility] [address] [place], [place]  [address] Phone - [phone]   Fax - [phone]  [facility] [address] [place], [place]  [address] Phone - [phone]   Fax - [phone] [facility] [address] [place], [place]  [address] Phone - [phone]   Fax - [phone]  [facility] [address] [place], [place]  [address] Phone - [phone]   Fax - [phone]  [facility] [address] [place], [place]  [address] Phone - [phone]   Fax - [phone]  [facility] Charlene Flemming MD [address] [place]  [address] Phone [phone]  Fax [phone]  Contraception Choices Contraception (birth control) is the use of any methods or devices to prevent   pregnancy. Below are some methods to help avoid pregnancy. Hormonal methods  Contraceptive implant. This is a thin, plastic tube containing progesterone hormone. It does not contain estrogen hormone. Your health care provider inserts the tube in the inner part of the upper arm. The tube can remain in place for up to 3 years. After 3 years, the implant must be removed. The implant prevents the  ovaries from releasing an egg (ovulation), thickens the cervical mucus to prevent sperm from entering the uterus, and thins the lining of the inside of the uterus.  Progesterone-only injections. These injections are given every 3 months by your health care provider to prevent pregnancy. This synthetic progesterone hormone stops the ovaries from releasing eggs. It also thickens cervical mucus and changes the uterine lining. This makes it harder for sperm to survive in the uterus.  Birth control pills. These pills contain estrogen and progesterone hormone. They work by preventing the ovaries from releasing eggs (ovulation). They also cause the cervical mucus to thicken, preventing the sperm from entering the uterus. Birth control pills are prescribed by a health care provider.Birth control pills can also be used to treat heavy periods.  Minipill. This type of birth control pill contains only the progesterone hormone. They are taken every day of each month and must be prescribed by your health care provider.  Birth control patch. The patch contains hormones similar to those in birth control pills. It must be changed once a week and is prescribed by a health care provider.  Vaginal ring. The ring contains hormones similar to those in birth control pills. It is left in the vagina for 3 weeks, removed for 1 week, and then a new one is put back in place. The patient must be comfortable inserting and removing the ring from the vagina.A health care provider's prescription is necessary.  Emergency contraception. Emergency contraceptives prevent pregnancy after unprotected sexual intercourse. This pill can be taken right after sex or up to 5 days after unprotected sex. It is most effective the sooner you take the pills after having sexual intercourse. Most emergency contraceptive pills are available without a prescription. Check with your pharmacist. Do not use emergency contraception as your only form of birth  control. Barrier methods  Female condom. This is a thin sheath (latex or rubber) that is worn over the penis during sexual intercourse. It can be used with spermicide to increase effectiveness.  Female condom. This is a soft, loose-fitting sheath that is put into the vagina before sexual intercourse.  Diaphragm. This is a soft, latex, dome-shaped barrier that must be fitted by a health care provider. It is inserted into the vagina, along with a spermicidal jelly. It is inserted before intercourse. The diaphragm should be left in the vagina for 6 to 8 hours after intercourse.  Cervical cap. This is a round, soft, latex or plastic cup that fits over the cervix and must be fitted by a health care provider. The cap can be left in place for up to 48 hours after intercourse.  Sponge. This is a soft, circular piece of polyurethane foam. The sponge has spermicide in it. It is inserted into the vagina after wetting it and before sexual intercourse.  Spermicides. These are chemicals that kill or block sperm from entering the cervix and uterus. They come in the form of creams, jellies, suppositories, foam, or tablets. They do not require a prescription. They are inserted into the vagina with an applicator before having sexual intercourse. The process   must be repeated every time you have sexual intercourse. Intrauterine contraception  Intrauterine device (IUD). This is a T-shaped device that is put in a woman's uterus during a menstrual period to prevent pregnancy. There are 2 types: ? Copper IUD. This type of IUD is wrapped in copper wire and is placed inside the uterus. Copper makes the uterus and fallopian tubes produce a fluid that kills sperm. It can stay in place for 10 years. ? Hormone IUD. This type of IUD contains the hormone progestin (synthetic progesterone). The hormone thickens the cervical mucus and prevents sperm from entering the uterus, and it also thins the uterine lining to prevent  implantation of a fertilized egg. The hormone can weaken or kill the sperm that get into the uterus. It can stay in place for 3-5 years, depending on which type of IUD is used. Permanent methods of contraception  Female tubal ligation. This is when the woman's fallopian tubes are surgically sealed, tied, or blocked to prevent the egg from traveling to the uterus.  Hysteroscopic sterilization. This involves placing a small coil or insert into each fallopian tube. Your doctor uses a technique called hysteroscopy to do the procedure. The device causes scar tissue to form. This results in permanent blockage of the fallopian tubes, so the sperm cannot fertilize the egg. It takes about 3 months after the procedure for the tubes to become blocked. You must use another form of birth control for these 3 months.  Female sterilization. This is when the female has the tubes that carry sperm tied off (vasectomy).This blocks sperm from entering the vagina during sexual intercourse. After the procedure, the man can still ejaculate fluid (semen). Natural planning methods  Natural family planning. This is not having sexual intercourse or using a barrier method (condom, diaphragm, cervical cap) on days the woman could become pregnant.  Calendar method. This is keeping track of the length of each menstrual cycle and identifying when you are fertile.  Ovulation method. This is avoiding sexual intercourse during ovulation.  Symptothermal method. This is avoiding sexual intercourse during ovulation, using a thermometer and ovulation symptoms.  Post-ovulation method. This is timing sexual intercourse after you have ovulated. Regardless of which type or method of contraception you choose, it is important that you use condoms to protect against the transmission of sexually transmitted infections (STIs). Talk with your health care provider about which form of contraception is most appropriate for you. This information is not  intended to replace advice given to you by your health care provider. Make sure you discuss any questions you have with your health care provider. Document Released: 01/13/2005 Document Revised: 06/21/2015 Document Reviewed: 07/08/2012 Elsevier Interactive Patient Education  2017 Elsevier Inc.  

## 2016-11-19 NOTE — Progress Notes (Signed)
   PRENATAL VISIT NOTE  Subjective:  Annette Steele is a 21 y.o. G2P1001 at 1161w1d being seen today for ongoing prenatal care.  She is currently monitored for the following issues for this low-risk pregnancy and has Supervision of other normal pregnancy, antepartum and GBS (group b Streptococcus) UTI complicating pregnancy, first trimester on her problem list.  Patient reports no complaints.  Contractions: Irregular. Vag. Bleeding: None.  Movement: (!) Decreased. Denies leaking of fluid.   The following portions of the patient's history were reviewed and updated as appropriate: allergies, current medications, past family history, past medical history, past social history, past surgical history and problem list. Problem list updated.  Objective:   Vitals:   11/19/16 0901  BP: 111/64  Pulse: 95  Weight: 174 lb 6.4 oz (79.1 kg)    Fetal Status: Fetal Heart Rate (bpm): 145      Movement: present   FH: 33  General:  Alert, oriented and cooperative. Patient is in no acute distress.  Skin: Skin is warm and dry. No rash noted.   Cardiovascular: Normal heart rate noted  Respiratory: Normal respiratory effort, no problems with respiration noted  Abdomen: Soft, gravid, appropriate for gestational age.  Pain/Pressure: Present     Pelvic: Cervical exam deferred        Extremities: Normal range of motion.  Edema: Trace  Mental Status:  Normal mood and affect. Normal behavior. Normal judgment and thought content.   Assessment and Plan:  Pregnancy: G2P1001 at 5661w1d  1. Supervision of other normal pregnancy, antepartum - Routine care   2. GBS (group b Streptococcus) UTI complicating pregnancy, first trimester - +in Urine in first trimester   Preterm labor symptoms and general obstetric precautions including but not limited to vaginal bleeding, contractions, leaking of fluid and fetal movement were reviewed in detail with the patient. Please refer to After Visit Summary for other counseling  recommendations.  Return in about 2 weeks (around 12/03/2016).   Thressa ShellerHeather Kol Consuegra, CNM

## 2016-11-24 ENCOUNTER — Encounter (HOSPITAL_COMMUNITY): Payer: Self-pay | Admitting: *Deleted

## 2016-11-24 ENCOUNTER — Inpatient Hospital Stay (HOSPITAL_COMMUNITY)
Admission: AD | Admit: 2016-11-24 | Discharge: 2016-11-24 | Disposition: A | Discharge status: Home / Self Care | Source: Ambulatory Visit | Attending: Obstetrics & Gynecology | Admitting: Obstetrics & Gynecology

## 2016-11-24 DIAGNOSIS — R109 Unspecified abdominal pain: Secondary | ICD-10-CM

## 2016-11-24 DIAGNOSIS — Z9889 Other specified postprocedural states: Secondary | ICD-10-CM | POA: Diagnosis not present

## 2016-11-24 DIAGNOSIS — R079 Chest pain, unspecified: Secondary | ICD-10-CM | POA: Insufficient documentation

## 2016-11-24 DIAGNOSIS — N898 Other specified noninflammatory disorders of vagina: Secondary | ICD-10-CM | POA: Diagnosis not present

## 2016-11-24 DIAGNOSIS — Z833 Family history of diabetes mellitus: Secondary | ICD-10-CM | POA: Insufficient documentation

## 2016-11-24 DIAGNOSIS — O99613 Diseases of the digestive system complicating pregnancy, third trimester: Secondary | ICD-10-CM | POA: Insufficient documentation

## 2016-11-24 DIAGNOSIS — Z79899 Other long term (current) drug therapy: Secondary | ICD-10-CM | POA: Insufficient documentation

## 2016-11-24 DIAGNOSIS — O26893 Other specified pregnancy related conditions, third trimester: Secondary | ICD-10-CM

## 2016-11-24 DIAGNOSIS — R102 Pelvic and perineal pain: Secondary | ICD-10-CM

## 2016-11-24 DIAGNOSIS — Z3A33 33 weeks gestation of pregnancy: Secondary | ICD-10-CM

## 2016-11-24 DIAGNOSIS — K219 Gastro-esophageal reflux disease without esophagitis: Secondary | ICD-10-CM

## 2016-11-24 DIAGNOSIS — O26899 Other specified pregnancy related conditions, unspecified trimester: Secondary | ICD-10-CM

## 2016-11-24 DIAGNOSIS — Z3689 Encounter for other specified antenatal screening: Secondary | ICD-10-CM

## 2016-11-24 LAB — URINALYSIS, ROUTINE W REFLEX MICROSCOPIC
BILIRUBIN URINE: NEGATIVE
GLUCOSE, UA: NEGATIVE mg/dL
HGB URINE DIPSTICK: NEGATIVE
KETONES UR: NEGATIVE mg/dL
LEUKOCYTES UA: NEGATIVE
Nitrite: NEGATIVE
PH: 7 (ref 5.0–8.0)
PROTEIN: NEGATIVE mg/dL
Specific Gravity, Urine: 1.002 — ABNORMAL LOW (ref 1.005–1.030)

## 2016-11-24 LAB — WET PREP, GENITAL
CLUE CELLS WET PREP: NONE SEEN
Sperm: NONE SEEN
Trich, Wet Prep: NONE SEEN
Yeast Wet Prep HPF POC: NONE SEEN

## 2016-11-24 MED ORDER — GI COCKTAIL ~~LOC~~
30.0000 mL | Freq: Once | ORAL | Status: AC
  Administered 2016-11-24: 30 mL via ORAL
  Filled 2016-11-24: qty 30

## 2016-11-24 MED ORDER — RANITIDINE HCL 150 MG PO TABS: 150.0000 mg | ORAL_TABLET | Freq: Every day | ORAL | 1 refills | Status: DC

## 2016-11-24 NOTE — MAU Note (Signed)
Is having sharp pains/tightness in mid chest, started earlier this morning.  Is having pain in stomach, not really cramping.   Pain in pelvic bones. Had some mucous d/c this morning.

## 2016-11-24 NOTE — Discharge Instructions (Signed)
Braxton Hicks Contractions °Contractions of the uterus can occur throughout pregnancy, but they are not always a sign that you are in labor. You may have practice contractions called Braxton Hicks contractions. These false labor contractions are sometimes confused with true labor. °What are Braxton Hicks contractions? °Braxton Hicks contractions are tightening movements that occur in the muscles of the uterus before labor. Unlike true labor contractions, these contractions do not result in opening (dilation) and thinning of the cervix. Toward the end of pregnancy (32-34 weeks), Braxton Hicks contractions can happen more often and may become stronger. These contractions are sometimes difficult to tell apart from true labor because they can be very uncomfortable. You should not feel embarrassed if you go to the hospital with false labor. °Sometimes, the only way to tell if you are in true labor is for your health care provider to look for changes in the cervix. The health care provider will do a physical exam and may monitor your contractions. If you are not in true labor, the exam should show that your cervix is not dilating and your water has not broken. °If there are no prenatal problems or other health problems associated with your pregnancy, it is completely safe for you to be sent home with false labor. You may continue to have Braxton Hicks contractions until you go into true labor. °How can I tell the difference between true labor and false labor? °· Differences °? False labor °? Contractions last 30-70 seconds.: Contractions are usually shorter and not as strong as true labor contractions. °? Contractions become very regular.: Contractions are usually irregular. °? Discomfort is usually felt in the top of the uterus, and it spreads to the lower abdomen and low back.: Contractions are often felt in the front of the lower abdomen and in the groin. °? Contractions do not go away with walking.: Contractions may  go away when you walk around or change positions while lying down. °? Contractions usually become more intense and increase in frequency.: Contractions get weaker and are shorter-lasting as time goes on. °? The cervix dilates and gets thinner.: The cervix usually does not dilate or become thin. °Follow these instructions at home: °· Take over-the-counter and prescription medicines only as told by your health care provider. °· Keep up with your usual exercises and follow other instructions from your health care provider. °· Eat and drink lightly if you think you are going into labor. °· If Braxton Hicks contractions are making you uncomfortable: °? Change your position from lying down or resting to walking, or change from walking to resting. °? Sit and rest in a tub of warm water. °? Drink enough fluid to keep your urine clear or pale yellow. Dehydration may cause these contractions. °? Do slow and deep breathing several times an hour. °· Keep all follow-up prenatal visits as told by your health care provider. This is important. °Contact a health care provider if: °· You have a fever. °· You have continuous pain in your abdomen. °Get help right away if: °· Your contractions become stronger, more regular, and closer together. °· You have fluid leaking or gushing from your vagina. °· You pass blood-tinged mucus (bloody show). °· You have bleeding from your vagina. °· You have low back pain that you never had before. °· You feel your baby’s head pushing down and causing pelvic pressure. °· Your baby is not moving inside you as much as it used to. °Summary °· Contractions that occur before labor are   called Braxton Hicks contractions, false labor, or practice contractions.  Braxton Hicks contractions are usually shorter, weaker, farther apart, and less regular than true labor contractions. True labor contractions usually become progressively stronger and regular and they become more frequent.  Manage discomfort from  [facility] by changing position, resting in a warm bath, drinking plenty of water, or practicing deep breathing. This information is not intended to replace advice given to you by your health care provider. Make sure you discuss any questions you have with your health care provider. Document Released: 01/13/2005 Document Revised: 12/03/2015 Document Reviewed: 12/03/2015 Elsevier Interactive Patient Education  2017 Elsevier Inc. Heartburn During Pregnancy Heartburn is pain or discomfort in the throat or chest. It may cause a burning feeling. It happens when stomach acid moves up into the tube that carries food from your mouth to your stomach (esophagus). Heartburn is common during pregnancy. It usually goes away or gets better after giving birth. Follow these instructions at home: Eating and drinking  Do not drink alcohol while you are pregnant.  Figure out which foods and beverages make you feel worse, and avoid them.  Beverages that you may want to avoid include: ? Coffee and tea (with or without caffeine). ? Energy drinks and sports drinks. ? Bubbly (carbonated) drinks or sodas. ? Citrus fruit juices.  Foods that you may want to avoid include: ? Chocolate and cocoa. ? Peppermint and mint flavorings. ? Garlic, onions, and horseradish. ? Spicy and acidic foods. These include peppers, chili powder, curry powder, vinegar, hot sauces, and barbecue sauce. ? Citrus fruits, such as oranges, lemons, and limes. ? Tomato-based foods, such as red sauce, chili, and salsa. ? Fried and fatty foods, such as donuts, french fries, potato chips, and high-fat dressings. ? High-fat meats, such as hot dogs, cold cuts, sausage, ham, and bacon. ? High-fat dairy items, such as whole milk, butter, and cheese.  Eat small meals often, instead of large meals.  Avoid drinking a lot of liquid with your meals.  Avoid eating meals during the 2-3 hours before you go to bed.  Avoid lying down  right after you eat.  Do not exercise right after you eat. Medicines  Take over-the-counter and prescription medicines only as told by your doctor.  Do not take aspirin, ibuprofen, or other NSAIDs unless your doctor tells you to do that.  Your doctor may tell you to avoid medicines that have sodium bicarbonate in them. General instructions  If told, raise the head of your bed about 6 inches (15 cm). You can do this by putting blocks under the legs. Sleeping with more pillows does not help with heartburn.  Do not use any products that contain nicotine or tobacco, such as cigarettes and e-cigarettes. If you need help quitting, ask your doctor.  Wear loose-fitting clothing.  Try to lower your stress, such as with yoga or meditation. If you need help, ask your doctor.  Stay at a healthy weight. If you are overweight, work with your doctor to safely lose weight.  Keep all follow-up visits as told by your doctor. This is important. Contact a doctor if:  You get new symptoms.  Your symptoms do not get better with treatment.  You have weight loss and you do not know why.  You have trouble swallowing.  You make loud sounds when you breathe (wheeze).  You have a cough that does not go away.  You have heartburn often for more than 2 weeks.  You feel  sick to your stomach (nauseous), and this does not get better with treatment.  You are throwing up (vomiting), and this does not get better with treatment.  You have pain in your belly (abdomen). Get help right away if:  You have very bad chest pain that spreads to your arm, neck, or jaw.  You feel sweaty, dizzy, or light-headed.  You have trouble breathing.  You have pain when swallowing.  You throw up and your throw-up looks like blood or coffee grounds.  Your poop (stool) is bloody or black. This information is not intended to replace advice given to you by your health care provider. Make sure you discuss any questions you  have with your health care provider. Document Released: 02/15/2010 Document Revised: 10/01/2015 Document Reviewed: 10/01/2015 Elsevier Interactive Patient Education  2017 Elsevier Inc. Abdominal Pain During Pregnancy Belly (abdominal) pain is common during pregnancy. Most of the time, it is not a serious problem. Other times, it can be a sign that something is wrong with the pregnancy. Always tell your doctor if you have belly pain. Follow these instructions at home: Monitor your belly pain for any changes. The following actions may help you feel better:  Do not have sex (intercourse) or put anything in your vagina until you feel better.  Rest until your pain stops.  Drink clear fluids if you feel sick to your stomach (nauseous). Do not eat solid food until you feel better.  Only take medicine as told by your doctor.  Keep all doctor visits as told.  Get help right away if:  You are bleeding, leaking fluid, or pieces of tissue come out of your vagina.  You have more pain or cramping.  You keep throwing up (vomiting).  You have pain when you pee (urinate) or have blood in your pee.  You have a fever.  You do not feel your baby moving as much.  You feel very weak or feel like passing out.  You have trouble breathing, with or without belly pain.  You have a very bad headache and belly pain.  You have fluid leaking from your vagina and belly pain.  You keep having watery poop (diarrhea).  Your belly pain does not go away after resting, or the pain gets worse. This information is not intended to replace advice given to you by your health care provider. Make sure you discuss any questions you have with your health care provider. Document Released: 01/01/2009 Document Revised: 08/22/2015 Document Reviewed: 08/12/2012 Elsevier Interactive Patient Education  [employer]2018 Else[employer] Inc.

## 2016-11-24 NOTE — MAU Provider Note (Signed)
History     CSN: [id number]  Arrival date and time: 11/24/16 1256   First Provider Initiated Contact with Patient 11/24/16 1345      Chief Complaint  Patient presents with  . Abdominal Pain  . Pelvic Pain  . Chest Pain  . Vaginal Discharge   G2P1001 @33 .6 wks here with vaginal discharge, abdominal pain, pelvic pain, and chest pain. She first noticed the discharge this am, and describes a clear-yellow and mucous, some malodor. No VB. No recent IC. The abdominal pain and chest pain started later. She describes the chest pain as center of sternum and feels "locked". Associated sx are SOB. No neck, jaw, or arm pain. Describes the abdominal pain as cramping all over abdomen. She has not taken anything for the pain. Reports daily heartburn, was using TUMS but no longer working so she drinks milk. Also has pain "at my pelvic bone". Worse with ambulation and position changes.    OB History    Gravida Para Term Preterm AB Living   2 1 1     1    SAB TAB Ectopic Multiple Live Births           1      Past Medical History:  Diagnosis Date  . Heart murmur     Past Surgical History:  Procedure Laterality Date  . ESOPHAGOGASTRODUODENOSCOPY    . WISDOM TOOTH EXTRACTION      Family History  Problem Relation Age of Onset  . Diabetes Father   . Diabetes Sister     Social History  Substance Use Topics  . Smoking status: Never Smoker  . Smokeless tobacco: Never Used  . Alcohol use No    Allergies: No Known Allergies  Prescriptions Prior to Admission  Medication Sig Dispense Refill Last Dose  . acetaminophen (TYLENOL) 500 MG tablet Take 1,000 mg by mouth every 6 (six) hours as needed for mild pain, moderate pain, fever or headache.   11/08/2016 at Unknown time    Review of Systems  Respiratory: Positive for shortness of breath.   Cardiovascular: Positive for chest pain.  Gastrointestinal: Positive for abdominal pain.  Genitourinary: Positive for pelvic pain and vaginal  discharge. Negative for vaginal bleeding.   Physical Exam   Blood pressure 125/67, pulse (!) 102, temperature (!) 97.3 F (36.3 C), temperature source Oral, resp. rate 18, weight 179 lb 4 oz (81.3 kg), last menstrual period 04/01/2016, SpO2 100 %.  Physical Exam  Constitutional: She is oriented to person, place, and time. She appears well-developed and well-nourished. No distress.  HENT:  Head: Normocephalic and atraumatic.  Neck: Normal range of motion.  Cardiovascular: Normal rate, regular rhythm and normal heart sounds.   Respiratory: Effort normal and breath sounds normal. No respiratory distress. She has no wheezes. She has no rales.  GI: Soft. She exhibits no distension. There is no tenderness.  gravid  Genitourinary:  Genitourinary Comments: External: no lesions or erythema Cervix closed/long   Musculoskeletal: Normal range of motion.  Neurological: She is alert and oriented to person, place, and time.  Skin: Skin is warm and dry.  Psychiatric: She has a normal mood and affect.  EFM: 140 bpm, mod variability, + accels, no decels Toco: none  Results for orders placed or performed during the hospital encounter of 11/24/16 (from the past 24 hour(s))  Urinalysis, Routine w reflex microscopic     Status: Abnormal   Collection Time: 11/24/16  1:19 PM  Result Value Ref Range   Color, Urine  STRAW (A) YELLOW   APPearance CLEAR CLEAR   Specific Gravity, Urine 1.002 (L) 1.005 - 1.030   pH 7.0 5.0 - 8.0   Glucose, UA NEGATIVE NEGATIVE mg/dL   Hgb urine dipstick NEGATIVE NEGATIVE   Bilirubin Urine NEGATIVE NEGATIVE   Ketones, ur NEGATIVE NEGATIVE mg/dL   Protein, ur NEGATIVE NEGATIVE mg/dL   Nitrite NEGATIVE NEGATIVE   Leukocytes, UA NEGATIVE NEGATIVE  Wet prep, genital     Status: Abnormal   Collection Time: 11/24/16  2:00 PM  Result Value Ref Range   Yeast Wet Prep HPF POC NONE SEEN NONE SEEN   Trich, Wet Prep NONE SEEN NONE SEEN   Clue Cells Wet Prep HPF POC NONE SEEN NONE  SEEN   WBC, Wet Prep HPF POC FEW (A) NONE SEEN   Sperm NONE SEEN    MAU Course  Procedures GI cocktail  MDM Labs and EKG ordered and reviewed. Sinus tachy noted on EKG. Pain resolved after GI cocktail. No evidence of UTI, vaginal infection, or PTL. Pelvic pain likely physiologic to advancing gestational age. Discussed comfort measures. Stable for discharge home.  Assessment and Plan   1. [redacted] weeks gestation of pregnancy   2. NST (non-stress test) reactive   3. Pelvic pain affecting pregnancy in third trimester, antepartum   4. Abdominal pain affecting pregnancy   5. Gastroesophageal reflux disease without esophagitis   6. Vaginal discharge during pregnancy in third trimester    Discharge home Follow up in Ob office as scheduled PTL precautions Tylenol Heat Maternity support belt Rx Zantac  Allergies as of 11/24/2016   No Known Allergies     Medication List    TAKE these medications   acetaminophen 500 MG tablet Commonly known as:  TYLENOL Take 1,000 mg by mouth every 6 (six) hours as needed for mild pain, moderate pain, fever or headache.   ranitidine 150 MG tablet Commonly known as:  ZANTAC Take 1 tablet (150 mg total) by mouth at bedtime.      Donette LarryMelanie Rebbeca Sheperd, CNM 11/24/2016, 2:01 PM

## 2016-11-24 NOTE — MAU Note (Signed)
Patient noticed clear, yellowish thick discharge this morning when using the bathroom.  Then started to have stomach cramping all over her abdomen. She then started to have tightness in her chest and felt like she was short of breathe.  She also is having lower pelvic pain.

## 2016-11-25 LAB — GC/CHLAMYDIA PROBE AMP (~~LOC~~) NOT AT ARMC
Chlamydia: NEGATIVE
Neisseria Gonorrhea: NEGATIVE

## 2016-12-03 ENCOUNTER — Ambulatory Visit (INDEPENDENT_AMBULATORY_CARE_PROVIDER_SITE_OTHER): Admitting: Family Medicine

## 2016-12-03 VITALS — BP 118/59 | HR 71 | Wt 179.0 lb

## 2016-12-03 DIAGNOSIS — Z3480 Encounter for supervision of other normal pregnancy, unspecified trimester: Secondary | ICD-10-CM

## 2016-12-03 NOTE — Progress Notes (Signed)
   PRENATAL VISIT NOTE  Subjective:  Annette Steele is a 21 y.o. G2P1001 at 5061w1d being seen today for ongoing prenatal care.  She is currently monitored for the following issues for this low-risk pregnancy and has Supervision of other normal pregnancy, antepartum and GBS (group b Streptococcus) UTI complicating pregnancy, first trimester on their problem list.  Patient reports no complaints.  Contractions: Irregular. Vag. Bleeding: None.  Movement: Present. Denies leaking of fluid.   The following portions of the patient's history were reviewed and updated as appropriate: allergies, current medications, past family history, past medical history, past social history, past surgical history and problem list. Problem list updated.  Objective:   Vitals:   12/03/16 0927  BP: (!) 118/59  Pulse: 71  Weight: 179 lb (81.2 kg)    Fetal Status: Fetal Heart Rate (bpm): 143 Fundal Height: 36 cm Movement: Present  Presentation: Vertex  General:  Alert, oriented and cooperative. Patient is in no acute distress.  Skin: Skin is warm and dry. No rash noted.   Cardiovascular: Normal heart rate noted  Respiratory: Normal respiratory effort, no problems with respiration noted  Abdomen: Soft, gravid, appropriate for gestational age.  Pain/Pressure: Present     Pelvic: Cervical exam deferred        Extremities: Normal range of motion.  Edema: Trace  Mental Status:  Normal mood and affect. Normal behavior. Normal judgment and thought content.   Assessment and Plan:  Pregnancy: G2P1001 at 2761w1d  1. Pregnancy at [redacted] weeks gestation - return in 1 week for GBS - no concerns  Preterm labor symptoms and general obstetric precautions including but not limited to vaginal bleeding, contractions, leaking of fluid and fetal movement were reviewed in detail with the patient. Please refer to After Visit Summary for other counseling recommendations.  Return in about 1 week (around 12/10/2016).   Rolm BookbinderAmber Kaheem Halleck,  DO

## 2016-12-10 ENCOUNTER — Ambulatory Visit (INDEPENDENT_AMBULATORY_CARE_PROVIDER_SITE_OTHER): Admitting: Medical

## 2016-12-10 ENCOUNTER — Encounter: Payer: Self-pay | Admitting: Medical

## 2016-12-10 VITALS — BP 125/55 | HR 62 | Wt 181.0 lb

## 2016-12-10 DIAGNOSIS — B951 Streptococcus, group B, as the cause of diseases classified elsewhere: Secondary | ICD-10-CM

## 2016-12-10 DIAGNOSIS — Z3480 Encounter for supervision of other normal pregnancy, unspecified trimester: Secondary | ICD-10-CM

## 2016-12-10 DIAGNOSIS — O2341 Unspecified infection of urinary tract in pregnancy, first trimester: Secondary | ICD-10-CM

## 2016-12-10 NOTE — Progress Notes (Signed)
   PRENATAL VISIT NOTE  Subjective:  Annette Steele is a 21 y.o. G2P1001 at 1060w1d being seen today for ongoing prenatal care.  She is currently monitored for the following issues for this low-risk pregnancy and has Supervision of other normal pregnancy, antepartum and GBS (group b Streptococcus) UTI complicating pregnancy, first trimester on their problem list.  Patient reports occasional contractions.  Contractions: Irregular. Vag. Bleeding: None.  Movement: Present. Denies leaking of fluid.   The following portions of the patient's history were reviewed and updated as appropriate: allergies, current medications, past family history, past medical history, past social history, past surgical history and problem list. Problem list updated.  Objective:   Vitals:   12/10/16 0958  BP: (!) 125/55  Pulse: 62  Weight: 181 lb (82.1 kg)    Fetal Status: Fetal Heart Rate (bpm): 134 Fundal Height: 38 cm Movement: Present  Presentation: Vertex  General:  Alert, oriented and cooperative. Patient is in no acute distress.  Skin: Skin is warm and dry. No rash noted.   Cardiovascular: Normal heart rate noted  Respiratory: Normal respiratory effort, no problems with respiration noted  Abdomen: Soft, gravid, appropriate for gestational age.  Pain/Pressure: Present     Pelvic: Cervical exam performed Dilation: 2 Effacement (%): 50 Station: -3  Extremities: Normal range of motion.  Edema: Trace  Mental Status:  Normal mood and affect. Normal behavior. Normal judgment and thought content.   Assessment and Plan:  Pregnancy: G2P1001 at 4160w1d  1. Supervision of other normal pregnancy, antepartum - Doing well, no complaints  - Fundal heigh 38 cm today, advised we will continue to monitor and order additional US if needed prior to delivery   2. GBS (group b Streptococcus) UTI complicating pregnancy, first trimester - Will treat in labor, discussed again today   Preterm labor symptoms and general  obstetric precautions including but not limited to vaginal bleeding, contractions, leaking of fluid and fetal movement were reviewed in detail with the patient. Please refer to After Visit Summary for other counseling recommendations.  Return in about 1 week (around 12/17/2016) for LOB.   Vonzella NippleJulie Aidynn Krenn, PA-C

## 2016-12-10 NOTE — Patient Instructions (Signed)
Fetal Movement Counts °Patient Name: ________________________________________________ Patient Due Date: ____________________ °What is a fetal movement count? °A fetal movement count is the number of times that you feel your baby move during a certain amount of time. This may also be called a fetal kick count. A fetal movement count is recommended for every pregnant woman. You may be asked to start counting fetal movements as early as week 28 of your pregnancy. °Pay attention to when your baby is most active. You may notice your baby's sleep and wake cycles. You may also notice things that make your baby move more. You should do a fetal movement count: °· When your baby is normally most active. °· At the same time each day. ° °A good time to count movements is while you are resting, after having something to eat and drink. °How do I count fetal movements? °1. Find a quiet, comfortable area. Sit, or lie down on your side. °2. Write down the date, the start time and stop time, and the number of movements that you felt between those two times. Take this information with you to your health care visits. °3. For 2 hours, count kicks, flutters, swishes, rolls, and jabs. You should feel at least 10 movements during 2 hours. °4. You may stop counting after you have felt 10 movements. °5. If you do not feel 10 movements in 2 hours, have something to eat and drink. Then, keep resting and counting for 1 hour. If you feel at least 4 movements during that hour, you may stop counting. °Contact a health care provider if: °· You feel fewer than 4 movements in 2 hours. °· Your baby is not moving like he or she usually does. °Date: ____________ Start time: ____________ Stop time: ____________ Movements: ____________ °Date: ____________ Start time: ____________ Stop time: ____________ Movements: ____________ °Date: ____________ Start time: ____________ Stop time: ____________ Movements: ____________ °Date: ____________ Start time:  ____________ Stop time: ____________ Movements: ____________ °Date: ____________ Start time: ____________ Stop time: ____________ Movements: ____________ °Date: ____________ Start time: ____________ Stop time: ____________ Movements: ____________ °Date: ____________ Start time: ____________ Stop time: ____________ Movements: ____________ °Date: ____________ Start time: ____________ Stop time: ____________ Movements: ____________ °Date: ____________ Start time: ____________ Stop time: ____________ Movements: ____________ °This information is not intended to replace advice given to you by your health care provider. Make sure you discuss any questions you have with your health care provider. °Document Released: 02/12/2006 Document Revised: 09/12/2015 Document Reviewed: 02/22/2015 °Elsevier Interactive Patient Education © 2018 [employer] °Braxton Hicks Contractions °Contractions of the uterus can occur throughout pregnancy, but they are not always a sign that you are in labor. You may have practice contractions called Braxton Hicks contractions. These false labor contractions are sometimes confused with true labor. °What are Braxton Hicks contractions? °Braxton Hicks contractions are tightening movements that occur in the muscles of the uterus before labor. Unlike true labor contractions, these contractions do not result in opening (dilation) and thinning of the cervix. Toward the end of pregnancy (32-34 weeks), Braxton Hicks contractions can happen more often and may become stronger. These contractions are sometimes difficult to tell apart from true labor because they can be very uncomfortable. You should not feel embarrassed if you go to the hospital with false labor. °Sometimes, the only way to tell if you are in true labor is for your health care provider to look for changes in the cervix. The health care provider will do a physical exam and may monitor your contractions. If   you are not in true labor, the exam  should show that your cervix is not dilating and your water has not broken. °If there are no prenatal problems or other health problems associated with your pregnancy, it is completely safe for you to be sent home with false labor. You may continue to have Braxton Hicks contractions until you go into true labor. °How can I tell the difference between true labor and false labor? °· Differences °? False labor °? Contractions last 30-70 seconds.: Contractions are usually shorter and not as strong as true labor contractions. °? Contractions become very regular.: Contractions are usually irregular. °? Discomfort is usually felt in the top of the uterus, and it spreads to the lower abdomen and low back.: Contractions are often felt in the front of the lower abdomen and in the groin. °? Contractions do not go away with walking.: Contractions may go away when you walk around or change positions while lying down. °? Contractions usually become more intense and increase in frequency.: Contractions get weaker and are shorter-lasting as time goes on. °? The cervix dilates and gets thinner.: The cervix usually does not dilate or become thin. °Follow these instructions at home: °· Take over-the-counter and prescription medicines only as told by your health care provider. °· Keep up with your usual exercises and follow other instructions from your health care provider. °· Eat and drink lightly if you think you are going into labor. °· If Braxton Hicks contractions are making you uncomfortable: °? Change your position from lying down or resting to walking, or change from walking to resting. °? Sit and rest in a tub of warm water. °? Drink enough fluid to keep your urine clear or pale yellow. Dehydration may cause these contractions. °? Do slow and deep breathing several times an hour. °· Keep all follow-up prenatal visits as told by your health care provider. This is important. °Contact a health care provider if: °· You have a  fever. °· You have continuous pain in your abdomen. °Get help right away if: °· Your contractions become stronger, more regular, and closer together. °· You have fluid leaking or gushing from your vagina. °· You pass blood-tinged mucus (bloody show). °· You have bleeding from your vagina. °· You have low back pain that you never had before. °· You feel your baby’s head pushing down and causing pelvic pressure. °· Your baby is not moving inside you as much as it used to. °Summary °· Contractions that occur before labor are called Braxton Hicks contractions, false labor, or practice contractions. °· Braxton Hicks contractions are usually shorter, weaker, farther apart, and less regular than true labor contractions. True labor contractions usually become progressively stronger and regular and they become more frequent. °· Manage discomfort from Braxton Hicks contractions by changing position, resting in a warm bath, drinking plenty of water, or practicing deep breathing. °This information is not intended to replace advice given to you by your health care provider. Make sure you discuss any questions you have with your health care provider. °Document Released: 01/13/2005 Document Revised: 12/03/2015 Document Reviewed: 12/03/2015 °Elsevier Interactive Patient Education © 2017 [employer] ° °

## 2016-12-12 ENCOUNTER — Inpatient Hospital Stay (HOSPITAL_COMMUNITY)

## 2016-12-12 ENCOUNTER — Other Ambulatory Visit: Payer: Self-pay

## 2016-12-12 ENCOUNTER — Encounter (HOSPITAL_COMMUNITY): Payer: Self-pay

## 2016-12-12 ENCOUNTER — Inpatient Hospital Stay (HOSPITAL_COMMUNITY)
Admission: AD | Admit: 2016-12-12 | Discharge: 2016-12-12 | Disposition: A | Discharge status: Home / Self Care | Source: Ambulatory Visit | Attending: Obstetrics and Gynecology | Admitting: Obstetrics and Gynecology

## 2016-12-12 DIAGNOSIS — O368130 Decreased fetal movements, third trimester, not applicable or unspecified: Secondary | ICD-10-CM

## 2016-12-12 DIAGNOSIS — Z3A36 36 weeks gestation of pregnancy: Secondary | ICD-10-CM

## 2016-12-12 DIAGNOSIS — O288 Other abnormal findings on antenatal screening of mother: Secondary | ICD-10-CM

## 2016-12-12 LAB — URINALYSIS, ROUTINE W REFLEX MICROSCOPIC
Bilirubin Urine: NEGATIVE
GLUCOSE, UA: NEGATIVE mg/dL
Hgb urine dipstick: NEGATIVE
KETONES UR: NEGATIVE mg/dL
LEUKOCYTES UA: NEGATIVE
Nitrite: NEGATIVE
PH: 7 (ref 5.0–8.0)
Protein, ur: NEGATIVE mg/dL
Specific Gravity, Urine: 1.001 — ABNORMAL LOW (ref 1.005–1.030)

## 2016-12-12 NOTE — MAU Note (Signed)
Had an appointment  On Wed, fetal movement discussed.  Here today, baby isn't moving as much today. Was having pains yesterday, not as bad today.

## 2016-12-12 NOTE — MAU Provider Note (Signed)
History   [id number]   Chief Complaint  Patient presents with  . Decreased Fetal Movement  . Abdominal Pain    HPI Annette Steele is a 21 y.o. female  G2P1001 here with report of decreased fetal movement since last night.  Reports feeling the baby move approximately 2 times in the past 24 hour.  Denies vaginal bleeding or leaking of fluid.  Feels occasional contraction.  Patient's last menstrual period was 04/01/2016 (approximate).  OB History  Gravida Para Term Preterm AB Living  2 1 1     1   SAB TAB Ectopic Multiple Live Births          1    # Outcome Date GA Lbr Len/2nd Weight Sex Delivery Anes PTL Lv  2 Current           1 Term 04/08/12   7 lb (3.175 kg) M Vag-Spont  N LIV      Past Medical History:  Diagnosis Date  . Heart murmur     Family History  Problem Relation Age of Onset  . Diabetes Father   . Diabetes Sister     Social History   Socioeconomic History  . Marital status: Married    Spouse name: None  . Number of children: None  . Years of education: None  . Highest education level: None  Social Needs  . Financial resource strain: None  . Food insecurity - worry: None  . Food insecurity - inability: None  . Transportation needs - medical: None  . Transportation needs - non-medical: None  Occupational History  . None  Tobacco Use  . Smoking status: Never Smoker  . Smokeless tobacco: Never Used  Substance and Sexual Activity  . Alcohol use: No  . Drug use: No  . Sexual activity: Yes    Birth control/protection: None    Comment: One partner  Other Topics Concern  . None  Social History Narrative  . None    No Known Allergies  No current facility-administered medications on file prior to encounter.    Current Outpatient Medications on File Prior to Encounter  Medication Sig Dispense Refill  . calcium & magnesium carbonates (MYLANTA) [id number] MG tablet Take 1 tablet as needed by mouth for heartburn.       Review of Systems   Constitutional: Negative.   Gastrointestinal: Negative.   Genitourinary: Negative.      Physical Exam   Vitals:   12/12/16 1637  BP: 115/75  Pulse: 82  Resp: 16  Temp: 98.4 F (36.9 C)  TempSrc: Oral  SpO2: 100%  Weight: 185 lb (83.9 kg)    Physical Exam  Nursing note and vitals reviewed. Constitutional: She is oriented to person, place, and time. She appears well-developed and well-nourished. No distress.  HENT:  Head: Normocephalic and atraumatic.  Eyes: Conjunctivae are normal. Right eye exhibits no discharge. Left eye exhibits no discharge. No scleral icterus.  Neck: Normal range of motion.  Respiratory: Effort normal. No respiratory distress.  GI: Soft. There is no tenderness.  Neurological: She is alert and oriented to person, place, and time.  Skin: Skin is warm and dry. She is not diaphoretic.  Psychiatric: She has a normal mood and affect. Her behavior is normal. Judgment and thought content normal.    MAU Course  Procedures Results for orders placed or performed during the hospital encounter of 12/12/16 (from the past 24 hour(s))  Urinalysis, Routine w reflex microscopic     Status: Abnormal   Collection  Time: 12/12/16  4:41 PM  Result Value Ref Range   Color, Urine COLORLESS (A) YELLOW   APPearance CLEAR CLEAR   Specific Gravity, Urine 1.001 (L) 1.005 - 1.030   pH 7.0 5.0 - 8.0   Glucose, UA NEGATIVE NEGATIVE mg/dL   Hgb urine dipstick NEGATIVE NEGATIVE   Bilirubin Urine NEGATIVE NEGATIVE   Ketones, ur NEGATIVE NEGATIVE mg/dL   Protein, ur NEGATIVE NEGATIVE mg/dL   Nitrite NEGATIVE NEGATIVE   Leukocytes, UA NEGATIVE NEGATIVE   No results found.  MDM NST:  Baseline: 140 bpm, Variability: Good {> 6 bpm), Accelerations: reassuring, 10x10 accels, Decelerations: Absent and ctx x1 BPP 8/8, AFI 15 Pt reports feeling movement since arriving back from ultrasound  Assessment and Plan  A:  1. Non-reactive NST (non-stress test)   2. [redacted] weeks gestation  of pregnancy   3. Decreased fetal movement affecting management of pregnancy in third trimester, single or unspecified fetus    P: Discharge home Discussed fetal movement form Discussed reasons to return to MAU Keep f/u with OB   Judeth HornLawrence, Kelechi Orgeron, NP 12/12/2016 5:31 PM

## 2016-12-12 NOTE — Discharge Instructions (Signed)

## 2016-12-17 ENCOUNTER — Ambulatory Visit (INDEPENDENT_AMBULATORY_CARE_PROVIDER_SITE_OTHER): Admitting: Obstetrics and Gynecology

## 2016-12-17 VITALS — BP 127/64 | HR 71 | Wt 182.8 lb

## 2016-12-17 DIAGNOSIS — O26843 Uterine size-date discrepancy, third trimester: Secondary | ICD-10-CM

## 2016-12-17 DIAGNOSIS — O2343 Unspecified infection of urinary tract in pregnancy, third trimester: Secondary | ICD-10-CM

## 2016-12-17 DIAGNOSIS — B951 Streptococcus, group B, as the cause of diseases classified elsewhere: Secondary | ICD-10-CM

## 2016-12-17 DIAGNOSIS — Z3480 Encounter for supervision of other normal pregnancy, unspecified trimester: Secondary | ICD-10-CM

## 2016-12-17 NOTE — Progress Notes (Signed)
   PRENATAL VISIT NOTE  Subjective:  Annette Steele is a 21 y.o. G2P1001 at 8054w1d being seen today for ongoing prenatal care.  She is currently monitored for the following issues for this low-risk pregnancy and has Supervision of other normal pregnancy, antepartum and GBS (group b Streptococcus) UTI complicating pregnancy, first trimester on their problem list.  Patient reports occasional contractions.  Contractions: Irregular. Vag. Bleeding: None.  Movement: Present. Denies leaking of fluid.   The following portions of the patient's history were reviewed and updated as appropriate: allergies, current medications, past family history, past medical history, past social history, past surgical history and problem list. Problem list updated.  Objective:   Vitals:   12/17/16 0848  BP: 127/64  Pulse: 71  Weight: 182 lb 12.8 oz (82.9 kg)    Fetal Status: Fetal Heart Rate (bpm): 142 Fundal Height: 40 cm Movement: Present  Presentation: Vertex  General:  Alert, oriented and cooperative. Patient is in no acute distress.  Skin: Skin is warm and dry. No rash noted.   Cardiovascular: Normal heart rate noted  Respiratory: Normal respiratory effort, no problems with respiration noted  Abdomen: Soft, gravid, appropriate for gestational age.  Pain/Pressure: Present     Pelvic: Cervical exam performed Dilation: 2.5 Effacement (%): 60 Station: Ballotable, -3  Extremities: Normal range of motion.  Edema: Trace  Mental Status:  Normal mood and affect. Normal behavior. Normal judgment and thought content.   Assessment and Plan:  Pregnancy: G2P1001 at 254w1d  Uterine size date discrepancy pregnancy, third trimester - Plan: MFM OB F/U growth U/S  Supervision of other normal pregnancy, antepartum - Await labor - Reviewed [facility] with patient and partner  GBS (group b Streptococcus) UTI complicating pregnancy, third trimester - Plan abx therapy in labor  Term labor symptoms and general obstetric  precautions including but not limited to vaginal bleeding, contractions, leaking of fluid and fetal movement were reviewed in detail with the patient. Please refer to After Visit Summary for other counseling recommendations.  Return in about 1 week (around 12/24/2016) for Return OB visit.   Raelyn Moraolitta Leshaun Biebel, CNM

## 2016-12-17 NOTE — Patient Instructions (Signed)
Fetal Movement Counts °Patient Name: ________________________________________________ Patient Due Date: ____________________ °What is a fetal movement count? °A fetal movement count is the number of times that you feel your baby move during a certain amount of time. This may also be called a fetal kick count. A fetal movement count is recommended for every pregnant woman. You may be asked to start counting fetal movements as early as week 28 of your pregnancy. °Pay attention to when your baby is most active. You may notice your baby's sleep and wake cycles. You may also notice things that make your baby move more. You should do a fetal movement count: °· When your baby is normally most active. °· At the same time each day. ° °A good time to count movements is while you are resting, after having something to eat and drink. °How do I count fetal movements? °1. Find a quiet, comfortable area. Sit, or lie down on your side. °2. Write down the date, the start time and stop time, and the number of movements that you felt between those two times. Take this information with you to your health care visits. °3. For 2 hours, count kicks, flutters, swishes, rolls, and jabs. You should feel at least 10 movements during 2 hours. °4. You may stop counting after you have felt 10 movements. °5. If you do not feel 10 movements in 2 hours, have something to eat and drink. Then, keep resting and counting for 1 hour. If you feel at least 4 movements during that hour, you may stop counting. °Contact a health care provider if: °· You feel fewer than 4 movements in 2 hours. °· Your baby is not moving like he or she usually does. °Date: ____________ Start time: ____________ Stop time: ____________ Movements: ____________ °Date: ____________ Start time: ____________ Stop time: ____________ Movements: ____________ °Date: ____________ Start time: ____________ Stop time: ____________ Movements: ____________ °Date: ____________ Start time:  ____________ Stop time: ____________ Movements: ____________ °Date: ____________ Start time: ____________ Stop time: ____________ Movements: ____________ °Date: ____________ Start time: ____________ Stop time: ____________ Movements: ____________ °Date: ____________ Start time: ____________ Stop time: ____________ Movements: ____________ °Date: ____________ Start time: ____________ Stop time: ____________ Movements: ____________ °Date: ____________ Start time: ____________ Stop time: ____________ Movements: ____________ °This information is not intended to replace advice given to you by your health care provider. Make sure you discuss any questions you have with your health care provider. °Document Released: 02/12/2006 Document Revised: 09/12/2015 Document Reviewed: 02/22/2015 °Elsevier Interactive Patient Education © 2018 [employer] ° °Third Trimester of Pregnancy °The third trimester is from week 29 through week 42, months 7 through 9. This trimester is when your unborn baby (fetus) is growing very fast. At the end of the ninth month, the unborn baby is about 20 inches in length. It weighs about 6-10 pounds. °Follow these instructions at home: °· Avoid all smoking, herbs, and alcohol. Avoid drugs not approved by your doctor. °· Do not use any tobacco products, including cigarettes, chewing tobacco, and electronic cigarettes. If you need help quitting, ask your doctor. You may get counseling or other support to help you quit. °· Only take medicine as told by your doctor. Some medicines are safe and some are not during pregnancy. °· Exercise only as told by your doctor. Stop exercising if you start having cramps. °· Eat regular, healthy meals. °· Wear a good support bra if your breasts are tender. °· Do not use hot tubs, steam rooms, or saunas. °· Wear your seat belt   when driving. °· Avoid raw meat, uncooked cheese, and liter boxes and soil used by cats. °· Take your prenatal vitamins. °· Take 1500-2000  milligrams of calcium daily starting at the 20th week of pregnancy until you deliver your baby. °· Try taking medicine that helps you poop (stool softener) as needed, and if your doctor approves. Eat more fiber by eating fresh fruit, vegetables, and whole grains. Drink enough fluids to keep your pee (urine) clear or pale yellow. °· Take warm water baths (sitz baths) to soothe pain or discomfort caused by hemorrhoids. Use hemorrhoid cream if your doctor approves. °· If you have puffy, bulging veins (varicose veins), wear support hose. Raise (elevate) your feet for 15 minutes, 3-4 times a day. Limit salt in your diet. °· Avoid heavy lifting, wear low heels, and sit up straight. °· Rest with your legs raised if you have leg cramps or low back pain. °· Visit your dentist if you have not gone during your pregnancy. Use a soft toothbrush to brush your teeth. Be gentle when you floss. °· You can have sex (intercourse) unless your doctor tells you not to. °· Do not travel far distances unless you must. Only do so with your doctor's approval. °· Take prenatal classes. °· Practice driving to the hospital. °· Pack your hospital bag. °· Prepare the baby's room. °· Go to your doctor visits. °Get help if: °· You are not sure if you are in labor or if your water has broken. °· You are dizzy. °· You have mild cramps or pressure in your lower belly (abdominal). °· You have a nagging pain in your belly area. °· You continue to feel sick to your stomach (nauseous), throw up (vomit), or have watery poop (diarrhea). °· You have bad smelling fluid coming from your vagina. °· You have pain with peeing (urination). °Get help right away if: °· You have a fever. °· You are leaking fluid from your vagina. °· You are spotting or bleeding from your vagina. °· You have severe belly cramping or pain. °· You lose or gain weight rapidly. °· You have trouble catching your breath and have chest pain. °· You notice sudden or extreme puffiness  (swelling) of your face, hands, ankles, feet, or legs. °· You have not felt the baby move in over an hour. °· You have severe headaches that do not go away with medicine. °· You have vision changes. °This information is not intended to replace advice given to you by your health care provider. Make sure you discuss any questions you have with your health care provider. °Document Released: 04/09/2009 Document Revised: 06/21/2015 Document Reviewed: 03/16/2012 °Elsevier Interactive Patient Education © 2017 Elsevier Inc. ° °

## 2016-12-21 ENCOUNTER — Inpatient Hospital Stay (EMERGENCY_DEPARTMENT_HOSPITAL)
Admission: AD | Admit: 2016-12-21 | Discharge: 2016-12-21 | Disposition: A | Discharge status: Home / Self Care | Source: Ambulatory Visit | Attending: Obstetrics and Gynecology | Admitting: Obstetrics and Gynecology

## 2016-12-21 ENCOUNTER — Encounter (HOSPITAL_COMMUNITY): Payer: Self-pay

## 2016-12-21 DIAGNOSIS — N898 Other specified noninflammatory disorders of vagina: Secondary | ICD-10-CM | POA: Diagnosis not present

## 2016-12-21 DIAGNOSIS — Z3493 Encounter for supervision of normal pregnancy, unspecified, third trimester: Secondary | ICD-10-CM

## 2016-12-21 DIAGNOSIS — Z3A37 37 weeks gestation of pregnancy: Secondary | ICD-10-CM | POA: Insufficient documentation

## 2016-12-21 DIAGNOSIS — B951 Streptococcus, group B, as the cause of diseases classified elsewhere: Secondary | ICD-10-CM

## 2016-12-21 DIAGNOSIS — O98813 Other maternal infectious and parasitic diseases complicating pregnancy, third trimester: Secondary | ICD-10-CM

## 2016-12-21 DIAGNOSIS — Z3480 Encounter for supervision of other normal pregnancy, unspecified trimester: Secondary | ICD-10-CM

## 2016-12-21 DIAGNOSIS — O2343 Unspecified infection of urinary tract in pregnancy, third trimester: Secondary | ICD-10-CM

## 2016-12-21 DIAGNOSIS — O2341 Unspecified infection of urinary tract in pregnancy, first trimester: Secondary | ICD-10-CM

## 2016-12-21 DIAGNOSIS — O26893 Other specified pregnancy related conditions, third trimester: Secondary | ICD-10-CM | POA: Diagnosis not present

## 2016-12-21 LAB — POCT FERN TEST: POCT Fern Test: NEGATIVE

## 2016-12-21 NOTE — MAU Provider Note (Signed)
Chief Complaint:  Rupture of Membranes   First Provider Initiated Contact with Patient 12/21/16 1424     HPI: Annette Steele is a [age] y.o. G2P1001 at 1837w5dwho presents to maternity admissions reporting leaking of fluid one time.  .I was asked to rule out rupture of membranes She reports good fetal movement, denies LOF, vaginal bleeding, vaginal itching/burning, urinary symptoms, h/a, dizziness, n/v, diarrhea, constipation or fever/chills.    Vaginal Discharge  The patient's primary symptoms include vaginal discharge. The patient's pertinent negatives include no genital itching, genital lesions or genital odor. The problem occurs rarely. The pain is mild. She is pregnant. Pertinent negatives include no constipation, diarrhea, dysuria, flank pain, headaches, nausea or vomiting. The vaginal discharge was clear and watery. There has been no bleeding. She has not been passing clots. She has not been passing tissue. Nothing aggravates the symptoms. She has tried nothing for the symptoms.    RN note: G2P1 @ 27.[redacted] wksga. Presents to triage for r/o SROM. Denies bleeding. + FM. efm appied. SVE 3/50/-3 Fern test done. Negative. Provider made aware.     Past Medical History: Past Medical History:  Diagnosis Date  . Heart murmur     Past obstetric history: OB History  Gravida Para Term Preterm AB Living  2 1 1     1   SAB TAB Ectopic Multiple Live Births          1    # Outcome Date GA Lbr Len/2nd Weight Sex Delivery Anes PTL Lv  2 Current           1 Term 04/08/12   7 lb (3.175 kg) M Vag-Spont  N LIV      Past Surgical History: Past Surgical History:  Procedure Laterality Date  . ESOPHAGOGASTRODUODENOSCOPY    . WISDOM TOOTH EXTRACTION      Family History: Family History  Problem Relation Age of Onset  . Diabetes Father   . Diabetes Sister     Social History: Social History   Tobacco Use  . Smoking status: Never Smoker  . Smokeless tobacco: Never Used  Substance Use Topics   . Alcohol use: No  . Drug use: No    Allergies: No Known Allergies  Meds:  No medications prior to admission.    I have reviewed patient's Past Medical Hx, Surgical Hx, Family Hx, Social Hx, medications and allergies.   ROS:  Review of Systems  Gastrointestinal: Negative for constipation, diarrhea, nausea and vomiting.  Genitourinary: Positive for vaginal discharge. Negative for dysuria and flank pain.  Neurological: Negative for headaches.   Other systems negative  Physical Exam   Patient Vitals for the past 24 hrs:  BP Temp Temp src Pulse Resp SpO2 Height Weight  12/21/16 1415 - - - - - 100 % - -  12/21/16 1325 136/66 97.9 F (36.6 C) Oral 96 18 100 % 5\' 5"  (1.651 m) 187 lb (84.8 kg)   Constitutional: Well-developed, well-nourished female in no acute distress.  Cardiovascular: normal rate and rhythm Respiratory: normal effort, clear to auscultation bilaterally GI: Abd soft, non-tender, gravid appropriate for gestational age.   No rebound or guarding. MS: Extremities nontender, no edema, normal ROM Neurologic: Alert and oriented x 4.  GU: Neg CVAT.  PELVIC EXAM: Cervix pink, visually closed, without lesion, scant white creamy discharge, vaginal walls and external genitalia normal     Cervix 3cm per RN  FHT:  Baseline 140 , moderate variability, accelerations present, no decelerations Contractions:  Irregular  Labs: Results for orders placed or performed during the hospital encounter of 12/21/16 (from the past 24 hour(s))  Fern Test     Status: Normal   Collection Time: 12/21/16  1:54 PM  Result Value Ref Range   POCT Fern Test Negative = intact amniotic membranes    A/Positive/-- (05/14 1117)  Imaging:    MAU Course/MDM: I have ordered labs and reviewed results.  NST reviewed and found to be reactive.  Treatments in MAU included Sterile speculum exam.    Assessment: 1. GBS (group b Streptococcus) UTI complicating pregnancy, first trimester   2.  Supervision of other normal pregnancy, antepartum   3.    Vaginal discharge, no evidence for ruptured membranes  Plan: Discharge home Labor precautions and fetal kick counts Follow up in Office for prenatal visits and recheck of status  Encouraged to return here or to other Urgent Care/ED if she develops worsening of symptoms, increase in pain, fever, or other concerning symptoms.   Pt stable at time of discharge.  Wynelle BourgeoisMarie Gisselle Galvis CNM, MSN Certified Nurse-Midwife 12/21/2016 2:24 PM

## 2016-12-21 NOTE — Discharge Instructions (Signed)

## 2016-12-21 NOTE — Progress Notes (Addendum)
G2P1 @ 27.[redacted] wksga. Presents to triage for r/o SROM. Denies bleeding. + FM. efm appied. SVE 3/50/-3  Fern test done. Negative. Provider made aware.   1417: Provider at bs for speculum exam and for assessment and repeat fern test. negative  Pt denies feeling ctx.   1436: discharge instructions given with pt understanding. Pt left unit via ambulatory with family.

## 2016-12-21 NOTE — MAU Note (Signed)
Pt reports ? Leaking fluid since 1230, some contractions.

## 2016-12-23 ENCOUNTER — Inpatient Hospital Stay (HOSPITAL_COMMUNITY)
Admission: AD | Admit: 2016-12-23 | Discharge: 2016-12-25 | DRG: 807 | Disposition: A | Discharge status: Home / Self Care | Source: Ambulatory Visit | Attending: Family Medicine | Admitting: Family Medicine

## 2016-12-23 ENCOUNTER — Encounter (HOSPITAL_COMMUNITY): Payer: Self-pay | Admitting: Emergency Medicine

## 2016-12-23 ENCOUNTER — Encounter (HOSPITAL_COMMUNITY): Payer: Self-pay | Admitting: Anesthesiology

## 2016-12-23 ENCOUNTER — Inpatient Hospital Stay (HOSPITAL_COMMUNITY): Admitting: Anesthesiology

## 2016-12-23 DIAGNOSIS — D649 Anemia, unspecified: Secondary | ICD-10-CM | POA: Diagnosis present

## 2016-12-23 DIAGNOSIS — Z3A38 38 weeks gestation of pregnancy: Secondary | ICD-10-CM | POA: Diagnosis not present

## 2016-12-23 DIAGNOSIS — O4292 Full-term premature rupture of membranes, unspecified as to length of time between rupture and onset of labor: Principal | ICD-10-CM | POA: Diagnosis present

## 2016-12-23 DIAGNOSIS — O9902 Anemia complicating childbirth: Secondary | ICD-10-CM | POA: Diagnosis present

## 2016-12-23 DIAGNOSIS — O2341 Unspecified infection of urinary tract in pregnancy, first trimester: Secondary | ICD-10-CM

## 2016-12-23 DIAGNOSIS — Z3480 Encounter for supervision of other normal pregnancy, unspecified trimester: Secondary | ICD-10-CM

## 2016-12-23 DIAGNOSIS — B951 Streptococcus, group B, as the cause of diseases classified elsewhere: Secondary | ICD-10-CM

## 2016-12-23 DIAGNOSIS — O99824 Streptococcus B carrier state complicating childbirth: Secondary | ICD-10-CM | POA: Diagnosis present

## 2016-12-23 LAB — CBC
HCT: 33.1 % — ABNORMAL LOW (ref 36.0–46.0)
Hemoglobin: 10.6 g/dL — ABNORMAL LOW (ref 12.0–15.0)
MCH: 25.9 pg — AB (ref 26.0–34.0)
MCHC: 32 g/dL (ref 30.0–36.0)
MCV: 80.9 fL (ref 78.0–100.0)
PLATELETS: 151 10*3/uL (ref 150–400)
RBC: 4.09 MIL/uL (ref 3.87–5.11)
RDW: 14.1 % (ref 11.5–15.5)
WBC: 9.4 10*3/uL (ref 4.0–10.5)

## 2016-12-23 LAB — ABO/RH: ABO/RH(D): A POS

## 2016-12-23 LAB — TYPE AND SCREEN
ABO/RH(D): A POS
Antibody Screen: NEGATIVE

## 2016-12-23 LAB — POCT FERN TEST: POCT Fern Test: POSITIVE

## 2016-12-23 LAB — RPR: RPR Ser Ql: NONREACTIVE

## 2016-12-23 MED ORDER — ZOLPIDEM TARTRATE 5 MG PO TABS: 5.0000 mg | ORAL_TABLET | Freq: Every evening | ORAL | Status: DC | PRN

## 2016-12-23 MED ORDER — DIPHENHYDRAMINE HCL 50 MG/ML IJ SOLN: 12.5000 mg | INTRAMUSCULAR | Status: DC | PRN

## 2016-12-23 MED ORDER — ONDANSETRON HCL 4 MG PO TABS: 4.0000 mg | ORAL_TABLET | ORAL | Status: DC | PRN

## 2016-12-23 MED ORDER — OXYTOCIN BOLUS FROM INFUSION
500.0000 mL | Freq: Once | INTRAVENOUS | Status: AC
  Administered 2016-12-23: 500 mL via INTRAVENOUS

## 2016-12-23 MED ORDER — TETANUS-DIPHTH-ACELL PERTUSSIS 5-2.5-18.5 LF-MCG/0.5 IM SUSP: 0.5000 mL | Freq: Once | INTRAMUSCULAR | Status: DC

## 2016-12-23 MED ORDER — FENTANYL CITRATE (PF) 100 MCG/2ML IJ SOLN: 100.0000 ug | INTRAMUSCULAR | Status: DC | PRN

## 2016-12-23 MED ORDER — LACTATED RINGERS IV SOLN
INTRAVENOUS | Status: DC
  Administered 2016-12-23: 125 mL via INTRAVENOUS
  Administered 2016-12-23: 07:00:00 via INTRAVENOUS

## 2016-12-23 MED ORDER — LACTATED RINGERS IV SOLN: 500.0000 mL | INTRAVENOUS | Status: DC | PRN

## 2016-12-23 MED ORDER — PRENATAL MULTIVITAMIN CH
1.0000 | ORAL_TABLET | Freq: Every day | ORAL | Status: DC
  Administered 2016-12-24: 1 via ORAL
  Filled 2016-12-23: qty 1

## 2016-12-23 MED ORDER — EPHEDRINE 5 MG/ML INJ
10.0000 mg | INTRAVENOUS | Status: DC | PRN
  Filled 2016-12-23: qty 2

## 2016-12-23 MED ORDER — ONDANSETRON HCL 4 MG/2ML IJ SOLN: 4.0000 mg | Freq: Four times a day (QID) | INTRAMUSCULAR | Status: DC | PRN

## 2016-12-23 MED ORDER — SOD CITRATE-CITRIC ACID 500-334 MG/5ML PO SOLN: 30.0000 mL | ORAL | Status: DC | PRN

## 2016-12-23 MED ORDER — DEXTROSE 5 % IV SOLN
5.0000 10*6.[IU] | Freq: Once | INTRAVENOUS | Status: AC
  Administered 2016-12-23: 5 10*6.[IU] via INTRAVENOUS
  Filled 2016-12-23: qty 5

## 2016-12-23 MED ORDER — LIDOCAINE HCL (PF) 1 % IJ SOLN
30.0000 mL | INTRAMUSCULAR | Status: DC | PRN
  Filled 2016-12-23: qty 30

## 2016-12-23 MED ORDER — BENZOCAINE-MENTHOL 20-0.5 % EX AERO: 1.0000 "application " | INHALATION_SPRAY | CUTANEOUS | Status: DC | PRN

## 2016-12-23 MED ORDER — PENICILLIN G POT IN DEXTROSE 60000 UNIT/ML IV SOLN
3.0000 10*6.[IU] | INTRAVENOUS | Status: DC
  Administered 2016-12-23 (×2): 3 10*6.[IU] via INTRAVENOUS
  Filled 2016-12-23 (×4): qty 50

## 2016-12-23 MED ORDER — OXYCODONE-ACETAMINOPHEN 5-325 MG PO TABS
1.0000 | ORAL_TABLET | ORAL | Status: DC | PRN
Start: 2016-12-23 — End: 2016-12-23

## 2016-12-23 MED ORDER — COCONUT OIL OIL
1.0000 "application " | TOPICAL_OIL | Status: DC | PRN
  Administered 2016-12-24: 1 via TOPICAL
  Filled 2016-12-23: qty 120

## 2016-12-23 MED ORDER — WITCH HAZEL-GLYCERIN EX PADS: 1.0000 "application " | MEDICATED_PAD | CUTANEOUS | Status: DC | PRN

## 2016-12-23 MED ORDER — TERBUTALINE SULFATE 1 MG/ML IJ SOLN
0.2500 mg | Freq: Once | INTRAMUSCULAR | Status: DC | PRN
  Filled 2016-12-23: qty 1

## 2016-12-23 MED ORDER — SIMETHICONE 80 MG PO CHEW: 80.0000 mg | CHEWABLE_TABLET | ORAL | Status: DC | PRN

## 2016-12-23 MED ORDER — DIPHENHYDRAMINE HCL 25 MG PO CAPS: 25.0000 mg | ORAL_CAPSULE | Freq: Four times a day (QID) | ORAL | Status: DC | PRN

## 2016-12-23 MED ORDER — OXYTOCIN 40 UNITS IN LACTATED RINGERS INFUSION - SIMPLE MED
2.5000 [IU]/h | INTRAVENOUS | Status: DC
  Filled 2016-12-23: qty 1000

## 2016-12-23 MED ORDER — SENNOSIDES-DOCUSATE SODIUM 8.6-50 MG PO TABS
2.0000 | ORAL_TABLET | ORAL | Status: DC
  Administered 2016-12-23 – 2016-12-24 (×2): 2 via ORAL
  Filled 2016-12-23 (×2): qty 2

## 2016-12-23 MED ORDER — IBUPROFEN 600 MG PO TABS
600.0000 mg | ORAL_TABLET | Freq: Four times a day (QID) | ORAL | Status: DC
  Administered 2016-12-23 – 2016-12-25 (×7): 600 mg via ORAL
  Filled 2016-12-23 (×7): qty 1

## 2016-12-23 MED ORDER — LACTATED RINGERS IV SOLN
500.0000 mL | Freq: Once | INTRAVENOUS | Status: AC
  Administered 2016-12-23: 500 mL via INTRAVENOUS

## 2016-12-23 MED ORDER — OXYCODONE-ACETAMINOPHEN 5-325 MG PO TABS: 2.0000 | ORAL_TABLET | ORAL | Status: DC | PRN

## 2016-12-23 MED ORDER — OXYTOCIN 40 UNITS IN LACTATED RINGERS INFUSION - SIMPLE MED
1.0000 m[IU]/min | INTRAVENOUS | Status: DC
  Administered 2016-12-23: 2 m[IU]/min via INTRAVENOUS

## 2016-12-23 MED ORDER — ONDANSETRON HCL 4 MG/2ML IJ SOLN: 4.0000 mg | INTRAMUSCULAR | Status: DC | PRN

## 2016-12-23 MED ORDER — ACETAMINOPHEN 325 MG PO TABS: 650.0000 mg | ORAL_TABLET | ORAL | Status: DC | PRN

## 2016-12-23 MED ORDER — LIDOCAINE HCL (PF) 1 % IJ SOLN
INTRAMUSCULAR | Status: DC | PRN
  Administered 2016-12-23: 8 mL via EPIDURAL
  Administered 2016-12-23: 4 mL via EPIDURAL

## 2016-12-23 MED ORDER — ACETAMINOPHEN 325 MG PO TABS
650.0000 mg | ORAL_TABLET | ORAL | Status: DC | PRN
  Administered 2016-12-24: 650 mg via ORAL
  Filled 2016-12-23: qty 2

## 2016-12-23 MED ORDER — PHENYLEPHRINE 40 MCG/ML (10ML) SYRINGE FOR IV PUSH (FOR BLOOD PRESSURE SUPPORT)
80.0000 ug | PREFILLED_SYRINGE | INTRAVENOUS | Status: DC | PRN
  Filled 2016-12-23: qty 5

## 2016-12-23 MED ORDER — FENTANYL 2.5 MCG/ML BUPIVACAINE 1/10 % EPIDURAL INFUSION (WH - ANES)
14.0000 mL/h | INTRAMUSCULAR | Status: DC | PRN
Start: 2016-12-23 — End: 2016-12-23
  Administered 2016-12-23 (×2): 14 mL/h via EPIDURAL
  Filled 2016-12-23: qty 100

## 2016-12-23 MED ORDER — DIBUCAINE 1 % RE OINT
1.0000 | TOPICAL_OINTMENT | RECTAL | Status: DC | PRN
Start: 2016-12-23 — End: 2016-12-25

## 2016-12-23 MED ORDER — PHENYLEPHRINE 40 MCG/ML (10ML) SYRINGE FOR IV PUSH (FOR BLOOD PRESSURE SUPPORT)
80.0000 ug | PREFILLED_SYRINGE | INTRAVENOUS | Status: DC | PRN
  Filled 2016-12-23: qty 10
  Filled 2016-12-23: qty 5

## 2016-12-23 NOTE — Anesthesia Preprocedure Evaluation (Signed)
Anesthesia Evaluation  Patient identified by MRN, date of birth, ID band Patient awake    Reviewed: Allergy & Precautions, NPO status , Patient's Chart, lab work & pertinent test results  Airway Mallampati: II  TM Distance: >3 FB Neck ROM: Full    Dental  (+) Dental Advisory Given   Pulmonary neg pulmonary ROS,    Pulmonary exam normal breath sounds clear to auscultation       Cardiovascular negative cardio ROS Normal cardiovascular exam Rhythm:Regular Rate:Normal     Neuro/Psych negative neurological ROS  negative psych ROS   GI/Hepatic negative GI ROS, Neg liver ROS,   Endo/Other  Obesity  Renal/GU negative Renal ROS  negative genitourinary   Musculoskeletal negative musculoskeletal ROS (+)   Abdominal   Peds  Hematology  (+) anemia ,   Anesthesia Other Findings   Reproductive/Obstetrics (+) Pregnancy                             Anesthesia Physical Anesthesia Plan  ASA: II  Anesthesia Plan: Epidural   Post-op Pain Management:    Induction:   PONV Risk Score and Plan:   Airway Management Planned: Natural Airway  Additional Equipment:   Intra-op Plan:   Post-operative Plan:   Informed Consent: I have reviewed the patients History and Physical, chart, labs and discussed the procedure including the risks, benefits and alternatives for the proposed anesthesia with the patient or authorized representative who has indicated his/her understanding and acceptance.     Plan Discussed with:   Anesthesia Plan Comments: (Labs reviewed. Platelets acceptable, patient not taking any blood thinning medications. Risks and benefits discussed with patient, patient expressed understanding and wished to proceed.)        Anesthesia Quick Evaluation  

## 2016-12-23 NOTE — H&P (Signed)
Obstetric History and Physical  Annette Steele is a 21 y.o. G2P1001 with IUP at 545w0d presenting for SROM with SOL. Patient states she has been having  regular contractions, minimal vaginal bleeding, ruptured membranes with clear fluid, with active fetal movement.    [facility] 9 weeks Dating: By LMP --->  Estimated Date of Delivery: 01/06/17 Pregnancy complications or risks: -low-risk pregnancy Patient Active Problem List   Diagnosis Date Noted  . GBS (group b Streptococcus) UTI complicating pregnancy, first trimester 06/16/2016  . Supervision of other normal pregnancy, antepartum 06/09/2016   She plans to breastfeed She is undecided for postpartum contraception.   Prenatal labs and studies: ABO, Rh: A/Positive/-- (05/14 1117) Antibody: Negative (05/14 1117) Rubella: 4.41 (05/14 1117) RPR: Non Reactive (09/24 0950)  HBsAg: Negative (05/14 1117)  HIV:   Non-reactive GBS: Positive 2 hr Glucola  normal Genetic screening normal Anatomy US normal  Prenatal Transfer Tool  Maternal Diabetes: No Genetic Screening: Normal Maternal Ultrasounds/Referrals: Normal Fetal Ultrasounds or other Referrals:  None Maternal Substance Abuse:  No Significant Maternal Medications:  None Significant Maternal Lab Results: Lab values include: Group B Strep positive  Past Medical History:  Diagnosis Date  . Heart murmur     Past Surgical History:  Procedure Laterality Date  . ESOPHAGOGASTRODUODENOSCOPY    . WISDOM TOOTH EXTRACTION      OB History  Gravida Para Term Preterm AB Living  2 1 1     1   SAB TAB Ectopic Multiple Live Births          1    # Outcome Date GA Lbr Len/2nd Weight Sex Delivery Anes PTL Lv  2 Current           1 Term 04/08/12   3.175 kg (7 lb) M Vag-Spont  N LIV      Social History   Socioeconomic History  . Marital status: Married    Spouse name: None  . Number of children: None  . Years of education: None  .  Highest education level: None  Social Needs  . Financial resource strain: None  . Food insecurity - worry: None  . Food insecurity - inability: None  . Transportation needs - medical: None  . Transportation needs - non-medical: None  Occupational History  . None  Tobacco Use  . Smoking status: Never Smoker  . Smokeless tobacco: Never Used  Substance and Sexual Activity  . Alcohol use: No  . Drug use: No  . Sexual activity: Yes    Birth control/protection: None    Comment: One partner  Other Topics Concern  . None  Social History Narrative  . None    Family History  Problem Relation Age of Onset  . Diabetes Father   . Diabetes Sister     No medications prior to admission.    No Known Allergies  Review of Systems: Negative except for what is mentioned in HPI.  Physical Exam: BP 129/85 (BP Location: Left Arm)   Pulse 81   Temp 98.1 F (36.7 C) (Oral)   Resp 17   LMP 04/01/2016 (Approximate)   SpO2 100%  CONSTITUTIONAL: Well-developed, well-nourished female in no acute distress.  HENT:  Normocephalic, atraumatic, External right and left ear normal. Oropharynx is clear and moist EYES: Conjunctivae and EOM are normal. Pupils are equal, round, and reactive to light. No scleral icterus.  NECK: Normal range of motion, supple, no masses SKIN: Skin is warm  and dry. No rash noted. Not diaphoretic. No erythema. No pallor. NEUROLOGIC: Alert and oriented to person, place, and time. Normal reflexes, muscle tone coordination. No cranial nerve deficit noted. PSYCHIATRIC: Normal mood and affect. Normal behavior. Normal judgment and thought content. CARDIOVASCULAR: Normal heart rate noted, regular rhythm RESPIRATORY: Effort and breath sounds normal, no problems with respiration noted ABDOMEN: Soft, nontender, nondistended, gravid. MUSCULOSKELETAL: Normal range of motion. No edema and no tenderness. 2+ distal pulses.  Cervical Exam: Dilation: 5 Effacement (%): 70 Cervical  Position: Posterior Station: -3 Presentation: Vertex Exam by:: h.[email or web address], rn  Presentation: cephalic FHT:  Baseline rate 135 bpm   Variability moderate  Accelerations present   Decelerations none Contractions: Every 2-4 mins   Pertinent Labs/Studies:   Results for orders placed or performed during the hospital encounter of 12/23/16 (from the past 24 hour(s))  POCT fern test     Status: Abnormal   Collection Time: 12/23/16  6:53 AM  Result Value Ref Range   POCT Fern Test Positive = ruptured amniotic membanes     Assessment : Annette Steele is a 21 y.o. G2P1001 at 6759w0d being admitted for SOL/SROM.  Plan: Labor: Expectant management. Augmentation as needed.  Analgesia as needed; plan for epidural FWB: Reassuring fetal heart tracing.   GBS positive in urine; PCN started Delivery plan: Hopeful for vaginal delivery   Caryl AdaJazma , DO OB Fellow Faculty Practice, [facility] 12/23/2016, 6:55 AM

## 2016-12-23 NOTE — Anesthesia Pain Management Evaluation Note (Signed)
  CRNA Pain Management Visit Note  Patient: Annette Steele, 21 y.o., female  "Hello [facility] am [facility]. We have an anesthesia team available at all times to provide care throughout the hospital, including epidural management and anesthesia for C-section. I don't know your plan for the delivery whether it a natural birth, water birth, IV sedation, nitrous supplementation, doula or epidural, but we want to meet your pain goals."   1.Was your pain managed to your expectations on prior hospitalizations?   Yes   2.What is your expectation for pain management during this hospitalization?     Epidural  3.How can we help you reach that goal? epidural  Record the patient's initial score and the patient's pain goal.   Pain: 7/10  Pain Goal: 0/10 [facility] wants you to be able to say your pain was always managed very well.  Salome ArntSterling, Hilary Milks Marie 12/23/2016

## 2016-12-23 NOTE — Anesthesia Procedure Notes (Signed)
Epidural Patient location during procedure: OB Start time: 12/23/2016 1:18 PM End time: 12/23/2016 1:25 PM  Staffing Anesthesiologist: Beryle LatheBrock, Thomas E, MD Performed: anesthesiologist   Preanesthetic Checklist Completed: patient identified, pre-op evaluation, timeout performed, IV checked, risks and benefits discussed and monitors and equipment checked  Epidural Patient position: sitting Prep: DuraPrep Patient monitoring: continuous pulse ox and blood pressure Approach: midline Location: L2-L3 Injection technique: LOR saline  Needle:  Needle type: Tuohy  Needle gauge: 17 G Needle length: 9 cm Needle insertion depth: 8 cm Catheter size: 19 Gauge Catheter at skin depth: 13 cm Test dose: negative and Other (1% lidocaine)  Additional Notes Patient identified. Risks including, but not limited to, bleeding, infection, nerve damage, paralysis, inadequate analgesia, blood pressure changes, nausea, vomiting, allergic reaction, postpartum back pain, itching, and headache were discussed. Patient expressed understanding and wished to proceed. Sterile prep and drape, including hand hygiene, mask, and sterile gloves were used. The patient was positioned and the spine was prepped. The skin was anesthetized with lidocaine. No paraesthesia or other complication noted. The patient did not experience any signs of intravascular injection such as tinnitus or metallic taste in mouth, nor signs of intrathecal spread such as rapid motor block. Please see nursing notes for vital signs. The patient tolerated the procedure well.   Leslye Peerhomas Brock, MDReason for block:procedure for pain

## 2016-12-23 NOTE — Progress Notes (Signed)
Annette Steele is a 21 y.o. G2P1001 at 519w0d.  Subjective: Patient is well pain controlled, feeling pressure  Objective: BP (!) 129/59   Pulse 73   Temp 97.7 F (36.5 C) (Oral)   Resp 20   Ht 5\' 5"  (1.651 m)   Wt 187 lb (84.8 kg)   LMP 04/01/2016 (Approximate)   SpO2 100%   BMI 31.12 kg/m    FHT:  FHR: 135 bpm, variability: appropriate,  accelerations:  15x15,  decelerations:  none UC:   Q 2-494minutes,  Dilation: Lip/rim Effacement (%): 100 Cervical Position: Posterior Station: +1 Presentation: Vertex Exam by:: Pincus BadderK Shaw CNM  Labs: Results for orders placed or performed during the hospital encounter of 12/23/16 (from the past 24 hour(s))  POCT fern test     Status: Abnormal   Collection Time: 12/23/16  6:53 AM  Result Value Ref Range   POCT Fern Test Positive = ruptured amniotic membanes   CBC     Status: Abnormal   Collection Time: 12/23/16  7:00 AM  Result Value Ref Range   WBC 9.4 4.0 - 10.5 K/uL   RBC 4.09 3.87 - 5.11 MIL/uL   Hemoglobin 10.6 (L) 12.0 - 15.0 g/dL   HCT 96.033.1 (L) 45.436.0 - 09.846.0 %   MCV 80.9 78.0 - 100.0 fL   MCH 25.9 (L) 26.0 - 34.0 pg   MCHC 32.0 30.0 - 36.0 g/dL   RDW 11.914.1 14.711.5 - 82.915.5 %   Platelets 151 150 - 400 K/uL  RPR     Status: None   Collection Time: 12/23/16  7:00 AM  Result Value Ref Range   RPR Ser Ql Non Reactive Non Reactive  Type and screen [facility]     Status: None   Collection Time: 12/23/16  7:00 AM  Result Value Ref Range   ABO/RH(D) A POS    Antibody Screen NEG    Sample Expiration 12/26/2016   ABO/Rh     Status: None   Collection Time: 12/23/16  7:00 AM  Result Value Ref Range   ABO/RH(D) A POS     Assessment / Plan:  AROM@1445  919w0d week IUP Labor: expectant Fetal Wellbeing:  Category 1 Pain Control:  epidural Anticipated MOD:  svd  Marthenia RollingBland, Annette Eckerson, DO 12/23/2016 3:30 PM

## 2016-12-23 NOTE — MAU Note (Signed)
Pt here with c/o contractions since about 0430, some bloody show. Was 3 cm on last exam.

## 2016-12-23 NOTE — Progress Notes (Signed)
Patient ID: Annette Steele, female   DOB: December 01, 1995, 21 y.o.   MRN: [id number]  Ctx now spacing out per pt; req augmentation; PCN x 2 doses  BP 136/86, other VSS FHR 130s, +accels, no decels Ctx irreg 2-6 mins Cx deferred (was 5cm) Vtx verified by bs U/S  IUP@term  GBS pos PROM  Will start Pit 2/2 Anticipate SVD  Cam HaiSHAW, Annette Steele CNM 12/23/2016 11:27 AM

## 2016-12-23 NOTE — Anesthesia Postprocedure Evaluation (Signed)
Anesthesia Post Note  Patient: Annette Steele  Procedure(s) Performed: AN AD HOC LABOR EPIDURAL     Patient location during evaluation: Mother Baby Anesthesia Type: Epidural Level of consciousness: awake and alert Pain management: pain level controlled Vital Signs Assessment: post-procedure vital signs reviewed and stable Respiratory status: spontaneous breathing, nonlabored ventilation and respiratory function stable Cardiovascular status: stable Postop Assessment: no headache, no backache and epidural receding Anesthetic complications: no    Last Vitals:  Vitals:   12/23/16 1800 12/23/16 1905  BP: 134/74 129/63  Pulse: 67 64  Resp: 16 16  Temp: 36.6 C (!) 36.3 C  SpO2:      Last Pain:  Vitals:   12/23/16 1905  TempSrc: Oral  PainSc: 0-No pain   Pain Goal: Patients Stated Pain Goal: 8 (12/23/16 0730)               Marrion CoyMERRITT,Emaly Boschert

## 2016-12-24 ENCOUNTER — Encounter: Payer: Self-pay | Admitting: Advanced Practice Midwife

## 2016-12-24 NOTE — Progress Notes (Signed)
POSTPARTUM PROGRESS NOTE  Post Partum Day 1 Subjective:  Annette Steele is a 21 y.o. G2P2002 5520w0d s/p SVD.  No acute events overnight.  Pt denies problems with ambulating, voiding or po intake.  She denies nausea or vomiting.  Pain is well controlled.  She has had flatus. She has not had bowel movement.  Lochia Minimal.   Objective: Blood pressure 127/71, pulse 66, temperature (!) 97.5 F (36.4 C), temperature source Oral, resp. rate 16, height 5\' 5"  (1.651 m), weight 169 lb 6.4 oz (76.8 kg), last menstrual period 04/01/2016, SpO2 100 %, unknown if currently breastfeeding.  Physical Exam:  General: alert, cooperative and no distress Lochia:normal flow Chest: no respiratory distress Heart:regular rate, distal pulses intact Abdomen: soft, nontender,  Uterine Fundus: firm, appropriately tender DVT Evaluation: No calf swelling or tenderness Extremities: no edema  Recent Labs    12/23/16 0700  HGB 10.6*  HCT 33.1*    Assessment/Plan:  ASSESSMENT: Annette Steele is a 21 y.o. [id number] 6320w0d s/p SVD  Plan for discharge tomorrow   LOS: 1 day   Scott BlandDO 12/24/2016, 9:09 AM   OB FELLOW POSTPARTUM PROGRESS NOTE ATTESTATION I have seen and examined this patient and agree with above documentation in the resident's note.   PPD#1, doing well Undecided on birth control. Counseled on LARCs, and handouts given on Nexplanon and IUDs  Frederik PearJulie P Kemarion Abbey, MD OB Fellow 12/24/2016

## 2016-12-24 NOTE — Lactation Note (Signed)
This note was copied from a baby's chart. Lactation Consultation Note Mom stated BF going well. Mom tried BF in football and cradle. Liked cradle best. Mom has pendulous breast w/compressible soft breast flat nipple that evert well w/stimulation, then flatten w/none. Mom demonstrated hand expression w/glistening of colostrum. Mom encouraged to feed baby 8-12 times/24 hours and with feeding cues.  Reviewed newborn feeding habits, STS, cluster feeding supply and demand. Mom asked when can she pump and bottle feed. LC stated can pump at any time, suggest hold bottle feeding until after 2 weeks for breast stimulation, establishing milk supply, and preventing nipple confusion.  Mom doesn't have WIC, but is going to apply. [facility] brochure given w/resources, support groups and LC services. Patient Name: Annette Steele [id number]'s Date: 12/24/2016 Reason for consult: Initial assessment   Maternal Data Has patient been taught Hand Expression?: Yes Does the patient have breastfeeding experience prior to this delivery?: No  Feeding    LATCH Score       Type of Nipple: Flat(everts w/stimulation)  Comfort (Breast/Nipple): Soft / non-tender        Interventions Interventions: Breast feeding basics reviewed  Lactation Tools Discussed/Used WIC Program: No   Consult Status Consult Status: Follow-up Date: 12/24/16(in pm) Follow-up type: In-patient    Charyl DancerCARVER, Kaliyan Osbourn G 12/24/2016, 3:58 AM

## 2016-12-25 MED ORDER — IBUPROFEN 600 MG PO TABS: 600.0000 mg | ORAL_TABLET | Freq: Four times a day (QID) | ORAL | 0 refills | Status: AC

## 2016-12-25 NOTE — Discharge Summary (Signed)
OB Discharge Summary     Patient Name: Annette Steele DOB: 07/06/1995 MRN: [id number]  Date of admission: 12/23/2016 Delivering MD: Marthenia RollingBLAND, SCOTT   Date of discharge: 12/25/2016  Admitting diagnosis: 38 WEEKS CTX LEAKING BLEEDING Intrauterine pregnancy: 1121w0d     Secondary diagnosis:  Principal Problem:   SVD (spontaneous vaginal delivery) Active Problems:   Labor and delivery indication for care or intervention    Discharge diagnosis: Term Pregnancy Delivered                                                                                                Post partum procedures:none  Augmentation: Pitocin  Complications: None  Hospital course:  Onset of Labor With Vaginal Delivery     21 y.o. yo [id number] at 8521w0d was admitted in Active Labor on 12/23/2016. Patient had an uncomplicated labor course as follows:  Membrane Rupture Time/Date: 5:45 AM ,12/23/2016   Intrapartum Procedures: Episiotomy: None [1]                                         Lacerations:  None [1]  Patient had a delivery of a Viable infant. 12/23/2016  Information for the patient's newborn:  Maudry Diegonthony, Girl Christelle [[id number]][[id number]]  Delivery Method: Vag-Spont    Pateint had an uncomplicated postpartum course.  She is ambulating, tolerating a regular diet, passing flatus, and urinating well. Patient is discharged home in stable condition on 12/25/16.   Physical exam  Vitals:   12/23/16 2315 12/24/16 0701 12/24/16 1842 12/25/16 0528  BP: 120/75 127/71 130/71 138/62  Pulse: 68 66 84 76  Resp: 16 16 19    Temp: 98.6 F (37 C) (!) 97.5 F (36.4 C) 98.3 F (36.8 C) 98.1 F (36.7 C)  TempSrc: Oral Oral Oral Oral  SpO2:    100%  Weight:  169 lb 6.4 oz (76.8 kg)  167 lb 6.4 oz (75.9 kg)  Height:       General: alert, cooperative and no distress Lochia: appropriate Uterine Fundus: firm Incision: N/A DVT Evaluation: No evidence of DVT seen on physical exam. No significant calf/ankle edema. Calf/Ankle edema is  present Labs: Lab Results  Component Value Date   WBC 9.4 12/23/2016   HGB 10.6 (L) 12/23/2016   HCT 33.1 (L) 12/23/2016   MCV 80.9 12/23/2016   PLT 151 12/23/2016   CMP Latest Ref Rng & Units 05/15/2016  Glucose 65 - 99 mg/dL 89  BUN 6 - 20 mg/dL 8  Creatinine 5.780.44 - 4.691.00 mg/dL 6.290.66  Sodium 528135 - 413145 mmol/L 135  Potassium 3.5 - 5.1 mmol/L 4.2  Chloride 101 - 111 mmol/L 105  CO2 22 - 32 mmol/L 24  Calcium 8.9 - 10.3 mg/dL 2.4(M8.8(L)  Total Protein 6.5 - 8.1 g/dL -  Total Bilirubin 0.3 - 1.2 mg/dL -  Alkaline Phos 38 - 010126 U/L -  AST 15 - 41 U/L -  ALT 14 - 54 U/L -    Discharge instruction: per After Visit Summary and "Baby  and Me Booklet".  After visit meds:  Allergies as of 12/25/2016   No Known Allergies     Medication List    TAKE these medications   ibuprofen 600 MG tablet Commonly known as:  ADVIL,MOTRIN Take 1 tablet (600 mg total) by mouth every 6 (six) hours.   prenatal multivitamin Tabs tablet Take 1 tablet by mouth daily at 12 noon.       Diet: routine diet  Activity: Advance as tolerated. Pelvic rest for 6 weeks.   Outpatient follow up: 4 weeks  Postpartum contraception: Undecided; discussed LARCs, handouts given  Newborn Data: Live born female  Birth Weight: 8 lb 13.1 oz (4000 g) APGAR: 9, 9  Newborn Delivery   Birth date/time:  12/23/2016 16:15:00 Delivery type:  Vaginal, Spontaneous     Baby Feeding: Breast Disposition:home with mother  12/25/2016 Frederik PearJulie P Degele, MD

## 2016-12-25 NOTE — Discharge Instructions (Signed)
Contraception Choices °Contraception, also called birth control, means things to use or ways to try not to get pregnant. °Hormonal birth control °This kind of birth control uses hormones. Here are some types of hormonal birth control: °· A tube that is put under skin of the arm (implant). The tube can stay in for as long as 3 years. °· Shots to get every 3 months (injections). °· Pills to take every day (birth control pills). °· A patch to change 1 time each week for 3 weeks (birth control patch). After that, the patch is taken off for 1 week. °· A ring to put in the vagina. The ring is left in for 3 weeks. Then it is taken out of the vagina for 1 week. Then a new ring is put in. °· Pills to take after unprotected sex (emergency birth control pills). ° °Barrier birth control °Here are some types of barrier birth control: °· A thin covering that is put on the penis before sex (female condom). The covering is thrown away after sex. °· A soft, loose covering that is put in the vagina before sex (female condom). The covering is thrown away after sex. °· A rubber bowl that sits over the cervix (diaphragm). The bowl must be made for you. The bowl is put into the vagina before sex. The bowl is left in for 6-8 hours after sex. It is taken out within 24 hours. °· A small, soft cup that fits over the cervix (cervical cap). The cup must be made for you. The cup can be left in for 6-8 hours after sex. It is taken out within 48 hours. °· A sponge that is put into the vagina before sex. It must be left in for at least 6 hours after sex. It must be taken out within 30 hours. Then it is thrown away. °· A chemical that kills or stops sperm from getting into the uterus (spermicide). It may be a pill, cream, jelly, or foam to put in the vagina. The chemical should be used at least 10-15 minutes before sex. ° °IUD (intrauterine) birth control °An IUD is a small, T-shaped piece of plastic. It is put inside the uterus. There are two  kinds: °· Hormone IUD. This kind can stay in for 3-5 years. °· Copper IUD. This kind can stay in for 10 years. ° °Permanent birth control °Here are some types of permanent birth control: °· Surgery to block the fallopian tubes. °· Having an insert put into each fallopian tube. °· Surgery to tie off the tubes that carry sperm (vasectomy). ° °Natural planning birth control °Here are some types of natural planning birth control: °· Not having sex on the days the woman could get pregnant. °· Using a calendar: °? To keep track of the length of each period. °? To find out what days pregnancy can happen. °? To plan to not have sex on days when pregnancy can happen. °· Watching for symptoms of ovulation and not having sex during ovulation. One way the woman can check for ovulation is to check her temperature. °· Waiting to have sex until after ovulation. ° °Summary °· Contraception, also called birth control, means things to use or ways to try not to get pregnant. °· Hormonal methods of birth control include implants, injections, pills, patches, vaginal rings, and emergency birth control pills. °· Barrier methods of birth control can include female condoms, female condoms, diaphragms, cervical caps, sponges, and spermicides. °· There are two types of   IUD (intrauterine device) birth control. An IUD can be put in a woman's uterus to prevent pregnancy for 3-5 years. °· Permanent sterilization can be done through a procedure for males, females, or both. °· Natural planning methods involve not having sex on the days when the woman could get pregnant. °This information is not intended to replace advice given to you by your health care provider. Make sure you discuss any questions you have with your health care provider. °Document Released: 11/10/2008 Document Revised: 01/24/2016 Document Reviewed: 01/24/2016 °Elsevier Interactive Patient Education © 2017 [employer] °Postpartum Care After Vaginal Delivery °The period of time  right after you deliver your newborn is called the postpartum period. °What kind of medical care will I receive? °· You may continue to receive fluids and medicines through an IV tube inserted into one of your veins. °· If an incision was made near your vagina (episiotomy) or if you had some vaginal tearing during delivery, cold compresses may be placed on your episiotomy or your tear. This helps to reduce pain and swelling. °· You may be given a squirt bottle to use when you go to the bathroom. You may use this until you are comfortable wiping as usual. To use the squirt bottle, follow these steps: °? Before you urinate, fill the squirt bottle with warm water. Do not use hot water. °? After you urinate, while you are sitting on the toilet, use the squirt bottle to rinse the area around your urethra and vaginal opening. This rinses away any urine and blood. °? You may do this instead of wiping. As you start healing, you may use the squirt bottle before wiping yourself. Make sure to wipe gently. °? Fill the squirt bottle with clean water every time you use the bathroom. °· You will be given sanitary pads to wear. °How can I expect to feel? °· You may not feel the need to urinate for several hours after delivery. °· You will have some soreness and pain in your abdomen and vagina. °· If you are breastfeeding, you may have uterine contractions every time you breastfeed for up to several weeks postpartum. Uterine contractions help your uterus return to its normal size. °· It is normal to have vaginal bleeding (lochia) after delivery. The amount and appearance of lochia is often similar to a menstrual period in the first week after delivery. It will gradually decrease over the next few weeks to a dry, yellow-brown discharge. For most women, lochia stops completely by 6-8 weeks after delivery. Vaginal bleeding can vary from woman to woman. °· Within the first few days after delivery, you may have breast engorgement. This  is when your breasts feel heavy, full, and uncomfortable. Your breasts may also throb and feel hard, tightly stretched, warm, and tender. After this occurs, you may have milk leaking from your breasts. Your health care provider can help you relieve discomfort due to breast engorgement. Breast engorgement should go away within a few days. °· You may feel more sad or worried than normal due to hormonal changes after delivery. These feelings should not last more than a few days. If these feelings do not go away after several days, speak with your health care provider. °How should I care for myself? °· Tell your health care provider if you have pain or discomfort. °· Drink enough water to keep your urine clear or pale yellow. °· Wash your hands thoroughly with soap and water for at least 20 seconds after changing your sanitary pads,   after using the toilet, and before holding or feeding your baby. °· If you are not breastfeeding, avoid touching your breasts a lot. Doing this can make your breasts produce more milk. °· If you become weak or lightheaded, or you feel like you might faint, ask for help before: °? Getting out of bed. °? Showering. °· Change your sanitary pads frequently. Watch for any changes in your flow, such as a sudden increase in volume, a change in color, the passing of large blood clots. If you pass a blood clot from your vagina, save it to show to your health care provider. Do not flush blood clots down the toilet without having your health care provider look at them. °· Make sure that all your vaccinations are up to date. This can help protect you and your baby from getting certain diseases. You may need to have immunizations done before you leave the hospital. °· If desired, talk with your health care provider about methods of family planning or birth control (contraception). °How can I start bonding with my baby? °Spending as much time as possible with your baby is very important. During this time,  you and your baby can get to know each other and develop a bond. Having your baby stay with you in your room (rooming in) can give you time to get to know your baby. Rooming in can also help you become comfortable caring for your baby. Breastfeeding can also help you bond with your baby. °How can I plan for returning home with my baby? °· Make sure that you have a car seat installed in your vehicle. °? Your car seat should be checked by a certified car seat installer to make sure that it is installed safely. °? Make sure that your baby fits into the car seat safely. °· Ask your health care provider any questions you have about caring for yourself or your baby. Make sure that you are able to contact your health care provider with any questions after leaving the hospital. °This information is not intended to replace advice given to you by your health care provider. Make sure you discuss any questions you have with your health care provider. °Document Released: 11/10/2006 Document Revised: 06/18/2015 Document Reviewed: 12/18/2014 °Elsevier Interactive Patient Education © 2018 Elsevier Inc. ° °

## 2016-12-31 ENCOUNTER — Ambulatory Visit (HOSPITAL_COMMUNITY)

## 2017-02-02 ENCOUNTER — Encounter: Payer: Self-pay | Admitting: Advanced Practice Midwife

## 2017-02-02 ENCOUNTER — Ambulatory Visit (INDEPENDENT_AMBULATORY_CARE_PROVIDER_SITE_OTHER): Admitting: Advanced Practice Midwife

## 2017-02-02 ENCOUNTER — Ambulatory Visit (INDEPENDENT_AMBULATORY_CARE_PROVIDER_SITE_OTHER): Admitting: Clinical

## 2017-02-02 VITALS — BP 117/62 | HR 69 | Ht 63.0 in | Wt 154.2 lb

## 2017-02-02 DIAGNOSIS — F418 Other specified anxiety disorders: Secondary | ICD-10-CM

## 2017-02-02 DIAGNOSIS — O99345 Other mental disorders complicating the puerperium: Secondary | ICD-10-CM

## 2017-02-02 DIAGNOSIS — F419 Anxiety disorder, unspecified: Secondary | ICD-10-CM

## 2017-02-02 DIAGNOSIS — Z1389 Encounter for screening for other disorder: Secondary | ICD-10-CM | POA: Diagnosis not present

## 2017-02-02 DIAGNOSIS — Z3009 Encounter for other general counseling and advice on contraception: Secondary | ICD-10-CM

## 2017-02-02 NOTE — BH Specialist Note (Signed)
Integrated Behavioral Health Initial Visit  MRN: [id number] Name: Annette Steele  Number of Integrated Behavioral Health Clinician visits:: 1/6 2nd visit total Session Start time: 4:50 Session End time: 5:20 Total time: 30 minutes  Type of Service: Integrated Behavioral Health- Individual/Family Interpretor:No. Interpretor Name and Language: n/a   Warm Hand Off Completed.       SUBJECTIVE: Annette Kocherlbony Kucher is a 22 y.o. female accompanied by n/a Patient was referred by Sharen CounterLisa Leftwich-Kirby, CNM for anxiety. Patient reports the following symptoms/concerns: Pt states her primary concern today is an increase in feelings of anxiety and a recent panic attack, that worsens when she is home alone in apartment; open to learning self-coping strategy. Duration of problem: Postpartum; Severity of problem: moderate  OBJECTIVE: Mood: Anxious and Affect: Appropriate Risk of harm to self or others: Intention to act on plan to harm others  LIFE CONTEXT: Family and Social: Lives with FOB, 4yo son; newborn daughter School/Work: FOB works  Self-Care: - Life Changes: Recent childbirth  GOALS ADDRESSED: Patient will: 1. Reduce symptoms of: anxiety 2. Increase knowledge and/or ability of: self-management skills  3. Demonstrate ability to: Increase healthy adjustment to current life circumstances and Increase adequate support systems for patient/family  INTERVENTIONS: Interventions utilized: Mindfulness or Management consultantelaxation Training and Psychoeducation and/or Health Education  Standardized Assessments completed: GAD-7 and PHQ 9  ASSESSMENT: Patient currently experiencing Anxiety disorder, unspecified.   Patient may benefit from psychoeducation and brief therapeutic intervention regarding coping with symptoms of anxiety and panic attack.Marland Kitchen.  PLAN: 1. Follow up with behavioral health clinician on : As needed 2. Behavioral recommendations:  -CALM relaxation breathing exercise daily -Consider apps as  additioinal self-coping strategy -Read educational material regarding coping with symptoms of anxiety with panic attack 3. Referral(s): Integrated Behavioral Health Services (In Clinic) 4. "From scale of 1-10, how likely are you to follow plan?": 9  Rae LipsJamie C Perris Conwell, LCSW   Depression screen [facility] 02/02/2017 12/17/2016 12/10/2016 12/03/2016 11/19/2016  Decreased Interest 1 0 0 1 1  Down, Depressed, Hopeless 0 0 0 0 0  PHQ - 2 Score 1 0 0 1 1  Altered sleeping 0 1 1 2 2   Tired, decreased energy 2 1 1 2 3   Change in appetite 0 1 1 2 1   Feeling bad or failure about yourself  0 0 0 0 0  Trouble concentrating 0 0 0 0 0  Moving slowly or fidgety/restless 0 0 0 0 0  Suicidal thoughts 0 0 0 0 0  PHQ-9 Score 3 3 3 7 7   ' GAD 7 : Generalized Anxiety Score 02/02/2017 12/17/2016 12/10/2016 12/03/2016  Nervous, Anxious, on Edge 3 1 1 1   Control/stop worrying 3 0 0 1  Worry too much - different things 3 0 0 1  Trouble relaxing 1 1 1  0  Restless 0 0 0 0  Easily annoyed or irritable 1 1 1 1   Afraid - awful might happen - 0 0 0  Total GAD 7 Score - 3 3 4

## 2017-02-02 NOTE — Progress Notes (Signed)
Subjective:     Annette Steele is a 22 y.o. female who presents for a postpartum visit. She is 5 weeks postpartum following a spontaneous vaginal delivery. I have fully reviewed the prenatal and intrapartum course. The delivery was at 38 gestational weeks. Outcome: spontaneous vaginal delivery. Anesthesia: epidural. Postpartum course has been normal except some emotional swings and anxiety. Baby's course has been normal. Baby is feeding by breast. Bleeding brown. Bowel function is normal. Bladder function is normal. Patient is not sexually active. Contraception method is none. Postpartum depression screening: positive.  She reports an increase in anxiety with emotional swings and crying almost daily.  She denies any wish to harm herself or her children and reports she is doing well but cannot control these waves of anxiety.  The following portions of the patient's history were reviewed and updated as appropriate: allergies, current medications, past family history, past medical history, past social history, past surgical history and problem list.  Review of Systems Pertinent items are noted in HPI. Pertinent items noted in HPI and remainder of comprehensive ROS otherwise negative.   Objective:    BP 117/62   Pulse 69   Ht 5\' 3"  (1.6 m)   Wt 154 lb 3.2 oz (69.9 kg)   BMI 27.32 kg/m   VS reviewed, nursing note reviewed,  Constitutional: well developed, well nourished, no distress HEENT: normocephalic CV: normal rate Pulm/chest wall: normal effort Abdomen: soft Neuro: alert and oriented x 3 Skin: warm, dry Psych: affect normal  Pelvic exam deferred   Assessment:   1. Postpartum care and examination   2. Postpartum anxiety --Pt to see Asher MuirJamie today - Ambulatory referral to Integrated Behavioral Health  3. Encounter for general counseling and advice on contraceptive management --Discussed LARCs as most effective forms of birth control.  Discussed benefits/risks of other methods.  Pt  undecided.  Discussed lactational amenorrhea and use of breastfeeding for contraception as most effective with frequent feeding and no supplementation.  Pt given pamphlets about LARCs today in the office.  Plan:   1. Follow up in: 1 year or as needed.

## 2017-02-27 ENCOUNTER — Emergency Department (HOSPITAL_COMMUNITY)
Admission: EM | Admit: 2017-02-27 | Discharge: 2017-02-27 | Disposition: A | Discharge status: Home / Self Care | Payer: Medicaid Other | Attending: Emergency Medicine | Admitting: Emergency Medicine

## 2017-02-27 ENCOUNTER — Other Ambulatory Visit: Payer: Self-pay

## 2017-02-27 ENCOUNTER — Encounter (HOSPITAL_COMMUNITY): Payer: Self-pay | Admitting: *Deleted

## 2017-02-27 DIAGNOSIS — R05 Cough: Secondary | ICD-10-CM | POA: Diagnosis not present

## 2017-02-27 DIAGNOSIS — R55 Syncope and collapse: Secondary | ICD-10-CM | POA: Diagnosis present

## 2017-02-27 DIAGNOSIS — R200 Anesthesia of skin: Secondary | ICD-10-CM | POA: Insufficient documentation

## 2017-02-27 DIAGNOSIS — Z79899 Other long term (current) drug therapy: Secondary | ICD-10-CM | POA: Diagnosis not present

## 2017-02-27 LAB — BASIC METABOLIC PANEL
ANION GAP: 8 (ref 5–15)
BUN: 12 mg/dL (ref 6–20)
CHLORIDE: 106 mmol/L (ref 101–111)
CO2: 24 mmol/L (ref 22–32)
Calcium: 9.4 mg/dL (ref 8.9–10.3)
Creatinine, Ser: 0.78 mg/dL (ref 0.44–1.00)
GFR calc Af Amer: >60 mL/min (ref >60)
GFR calc non Af Amer: >60 mL/min (ref >60)
GLUCOSE: 88 mg/dL (ref 65–99)
POTASSIUM: 4.1 mmol/L (ref 3.5–5.1)
Sodium: 138 mmol/L (ref 135–145)

## 2017-02-27 LAB — CBC
HEMATOCRIT: 38.6 % (ref 36.0–46.0)
HEMOGLOBIN: 12.9 g/dL (ref 12.0–15.0)
MCH: 27.3 pg (ref 26.0–34.0)
MCHC: 33.4 g/dL (ref 30.0–36.0)
MCV: 81.6 fL (ref 78.0–100.0)
Platelets: 211 10*3/uL (ref 150–400)
RBC: 4.73 MIL/uL (ref 3.87–5.11)
RDW: 17.3 % — ABNORMAL HIGH (ref 11.5–15.5)
WBC: 6.6 10*3/uL (ref 4.0–10.5)

## 2017-02-27 LAB — URINALYSIS, ROUTINE W REFLEX MICROSCOPIC
BILIRUBIN URINE: NEGATIVE
Glucose, UA: NEGATIVE mg/dL
HGB URINE DIPSTICK: NEGATIVE
Ketones, ur: NEGATIVE mg/dL
Leukocytes, UA: NEGATIVE
Nitrite: NEGATIVE
Protein, ur: NEGATIVE mg/dL
SPECIFIC GRAVITY, URINE: 1.02 (ref 1.005–1.030)
pH: 5 (ref 5.0–8.0)

## 2017-02-27 LAB — I-STAT BETA HCG BLOOD, ED (MC, WL, AP ONLY)

## 2017-02-27 LAB — CBG MONITORING, ED: GLUCOSE-CAPILLARY: 78 mg/dL (ref 65–99)

## 2017-02-27 MED ORDER — SODIUM CHLORIDE 0.9 % IV BOLUS (SEPSIS)
500.0000 mL | Freq: Once | INTRAVENOUS | Status: AC
  Administered 2017-02-27: 500 mL via INTRAVENOUS

## 2017-02-27 NOTE — ED Triage Notes (Signed)
Pt reports reaching for object and then "blacked out." pt caught herself on wall, remembers the event. States her tongue felt numb during the episode. Denies any other symptoms and no acute distress is noted at triage.

## 2017-02-27 NOTE — ED Provider Notes (Signed)
[facility] EMERGENCY DEPARTMENT Provider Note   CSN: [id number] Arrival date & time: 02/27/17  0913     History   Chief Complaint Chief Complaint  Patient presents with  . Near Syncope    HPI Annette Steele is a 22 y.o. female with no significant past medical history, who presents to ED for evaluation of one episode of near syncope that occurred just prior to arrival.  She states that she was reaching up in her bathroom grabbing some Q-tips when she got tunnel vision and describes her vision going "dark and then light then dark then light again."  She began to fall backwards but caught herself on the wall.  She remembers the event and states that her boyfriend came to help her afterwards but did not witness the event.  She reports history of similar symptoms in the past several times since her teenage years with resolution.  States that she gets these symptoms "maybe once every 3 years."  She denies any head injury or complete loss of consciousness.  She is able to recall the event.  She did state that her "tongue felt numb" during the episode which happens to her intermittently several times a month.  She currently states being back at her baseline and denies any vision changes, headache, chest pain, shortness of breath, hemoptysis, vomiting, diarrhea or abdominal pain.  She has been having a dry cough for the past several days.  No family history of sudden cardiac death at a young age, personal history of DVT or PE, recent surgeries, recent prolonged travel or OCP use.  HPI  Past Medical History:  Diagnosis Date  . Heart murmur     Patient Active Problem List   Diagnosis Date Noted  . SVD (spontaneous vaginal delivery) 12/25/2016  . Labor and delivery indication for care or intervention 12/23/2016  . GBS (group b Streptococcus) UTI complicating pregnancy, first trimester 06/16/2016  . Supervision of other normal pregnancy, antepartum 06/09/2016    Past Surgical  History:  Procedure Laterality Date  . ESOPHAGOGASTRODUODENOSCOPY    . WISDOM TOOTH EXTRACTION      OB History    Gravida Para Term Preterm AB Living   2 2 2     2    SAB TAB Ectopic Multiple Live Births         0 2       Home Medications    Prior to Admission medications   Medication Sig Start Date End Date Taking? Authorizing Provider  ibuprofen (ADVIL,MOTRIN) 600 MG tablet Take 1 tablet (600 mg total) by mouth every 6 (six) hours. Patient not taking: Reported on 02/02/2017 12/25/16   Degele, Kandra Nicolas, MD  Prenatal Vit-Fe Fumarate-FA (PRENATAL MULTIVITAMIN) TABS tablet Take 1 tablet by mouth daily at 12 noon.    [provider]    Family History Family History  Problem Relation Age of Onset  . Diabetes Father   . Diabetes Sister     Social History Social History   Tobacco Use  . Smoking status: Never Smoker  . Smokeless tobacco: Never Used  Substance Use Topics  . Alcohol use: No  . Drug use: No     Allergies   Patient has no known allergies.   Review of Systems Review of Systems  Constitutional: Negative for appetite change, chills and fever.  HENT: Negative for ear pain, rhinorrhea, sneezing and sore throat.   Eyes: Negative for photophobia and visual disturbance.  Respiratory: Positive for cough. Negative for  chest tightness, shortness of breath and wheezing.   Cardiovascular: Negative for chest pain and palpitations.  Gastrointestinal: Negative for abdominal pain, blood in stool, constipation, diarrhea, nausea and vomiting.  Genitourinary: Negative for dysuria, hematuria and urgency.  Musculoskeletal: Negative for myalgias.  Skin: Negative for rash.  Neurological: Positive for syncope (Near syncope). Negative for dizziness, weakness and light-headedness.     Physical Exam Updated Vital Signs BP 113/69   Pulse (!) 58   Temp 98.3 F (36.8 C) (Oral)   Resp 14   SpO2 100%   Physical Exam  Constitutional: She is oriented to person, place,  and time. She appears well-developed and well-nourished. No distress.  Nontoxic appearing and in no acute distress.  Does not appear dehydrated.  HENT:  Head: Normocephalic and atraumatic.  Nose: Nose normal.  Eyes: Conjunctivae and EOM are normal. Right eye exhibits no discharge. Left eye exhibits no discharge. No scleral icterus.  Neck: Normal range of motion. Neck supple.  Cardiovascular: Normal rate, regular rhythm, normal heart sounds and intact distal pulses. Exam reveals no gallop and no friction rub.  No murmur heard. Pulmonary/Chest: Effort normal and breath sounds normal. No respiratory distress.  Abdominal: Soft. Bowel sounds are normal. She exhibits no distension. There is no tenderness. There is no guarding.  Musculoskeletal: Normal range of motion. She exhibits no edema.  No lower extremity edema, erythema or calf tenderness noted bilaterally.  Neurological: She is alert and oriented to person, place, and time. No cranial nerve deficit or sensory deficit. She exhibits normal muscle tone. Coordination normal.  Alert and oriented to person, place, time and current events. Pupils reactive. No facial asymmetry noted. Cranial nerves appear grossly intact. Sensation intact to light touch on face, BUE and BLE. Strength 5/5 in BUE and BLE. Normal finger to nose coordination bilaterally.  Skin: Skin is warm and dry. No rash noted.  No signs of head injury or C-spine tenderness noted.  Psychiatric: She has a normal mood and affect.  Nursing note and vitals reviewed.    ED Treatments / Results  Labs (all labs ordered are listed, but only abnormal results are displayed) Labs Reviewed  CBC - Abnormal; Notable for the following components:      Result Value   RDW 17.3 (*)    All other components within normal limits  BASIC METABOLIC PANEL  URINALYSIS, ROUTINE W REFLEX MICROSCOPIC  CBG MONITORING, ED  I-STAT BETA HCG BLOOD, ED ([facility], [facility], [facility] ONLY)    EKG  EKG  Interpretation  Date/Time:  Friday February 27 2017 09:18:00 EST Ventricular Rate:  76 PR Interval:  148 QRS Duration: 82 QT Interval:  380 QTC Calculation: 427 R Axis:   59 Text Interpretation:  Normal sinus rhythm with sinus arrhythmia Normal ECG nl intervals Confirmed by Meridee Score [phone]) on 02/27/2017 10:12:13 AM       Radiology No results found.  Procedures Procedures (including critical care time)  Medications Ordered in ED Medications  sodium chloride 0.9 % bolus 500 mL (500 mLs Intravenous New Bag/Given 02/27/17 1220)     Initial Impression / Assessment and Plan / ED Course  I have reviewed the triage vital signs and the nursing notes.  Pertinent labs & imaging results that were available during my care of the patient were reviewed by me and considered in my medical decision making (see chart for details).     Patient presents to ED for evaluation of near prior to arrival.  She was reaching overhead in her  bathroom when she felt like she was going to pass out and states that her vision went from "dark to light to dark to light."  She reports similar symptoms in the past several years since she was a teenager.  She has never been evaluated for these symptoms.  She states that currently she is back to baseline denies any memory changes or head injuries.  She denies any chest pain, shortness of breath, recent viral illnesses, fever, changes in gait.  On physical exam she is overall well-appearing.  She denies chest pain.  She is PERC and Wells criteria negative.  She is ambulatory with normal gait here.  Her lab work is unremarkable including hCG, CBC, urinalysis, BMP, CBG.  EKG showed normal sinus rhythm so I do not suspect that this is neurological, cardiac or pulmonary in nature.  I suspect vasovagal syncope.  Will advised patient to maintain adequate hydration and to follow-up at [facility] to establish primary care.  Advised to return for any severe or worsening  symptoms.   Portions of this note were generated with Scientist, clinical (histocompatibility and immunogenetics)Dragon dictation software. Dictation errors may occur despite best attempts at proofreading.   Final Clinical Impressions(s) / ED Diagnoses   Final diagnoses:  Near syncope    ED Discharge Orders    None       Dietrich PatesKhatri, Eamon Tantillo, PA-C 02/27/17 1254    Terrilee FilesButler, Michael C, MD 02/28/17 [phone]

## 2017-02-27 NOTE — Discharge Instructions (Signed)
Please read attached information regarding your condition. Push fluids to maintain adequate hydration.  Do not overexert yourself to prevent any subsequent near syncope episodes. [facility] for [facility] and established primary care. Return to ED for worsening symptoms, trouble breathing, loss of consciousness, head injury, vision changes or trouble walking.
